# Patient Record
Sex: Male | Born: 1950 | Race: Black or African American | Hispanic: No | Marital: Married | Smoking: Current every day smoker
Health system: Southern US, Community
[De-identification: ages and names within clinical notes are randomized; demographics above are authoritative.]

## PROBLEM LIST (undated history)

## (undated) DIAGNOSIS — I10 Essential (primary) hypertension: Secondary | ICD-10-CM

---

## 1999-02-19 ENCOUNTER — Encounter: Payer: Self-pay | Admitting: Neurosurgery

## 1999-02-19 ENCOUNTER — Ambulatory Visit (HOSPITAL_COMMUNITY): Admission: RE | Admit: 1999-02-19 | Discharge: 1999-02-19 | Payer: Self-pay | Admitting: Neurosurgery

## 2010-02-07 ENCOUNTER — Emergency Department: Payer: Self-pay | Admitting: Emergency Medicine

## 2012-06-16 ENCOUNTER — Ambulatory Visit: Payer: Self-pay | Admitting: Gastroenterology

## 2012-06-17 LAB — PATHOLOGY REPORT

## 2014-02-03 ENCOUNTER — Emergency Department: Payer: Self-pay | Admitting: Emergency Medicine

## 2014-02-03 LAB — COMPREHENSIVE METABOLIC PANEL
Albumin: 3.9 g/dL (ref 3.4–5.0)
Alkaline Phosphatase: 63 U/L
Anion Gap: 3 — ABNORMAL LOW (ref 7–16)
BUN: 13 mg/dL (ref 7–18)
Bilirubin,Total: 0.5 mg/dL (ref 0.2–1.0)
Calcium, Total: 8.7 mg/dL (ref 8.5–10.1)
Chloride: 100 mmol/L (ref 98–107)
Co2: 30 mmol/L (ref 21–32)
Creatinine: 1.01 mg/dL (ref 0.60–1.30)
EGFR (African American): >60
EGFR (Non-African Amer.): >60
Glucose: 94 mg/dL (ref 65–99)
Osmolality: 266 (ref 275–301)
Potassium: 3.7 mmol/L (ref 3.5–5.1)
SGOT(AST): 33 U/L (ref 15–37)
SGPT (ALT): 28 U/L (ref 12–78)
Sodium: 133 mmol/L — ABNORMAL LOW (ref 136–145)
Total Protein: 7.8 g/dL (ref 6.4–8.2)

## 2014-02-03 LAB — URINALYSIS, COMPLETE
Bacteria: NONE SEEN
Bilirubin,UR: NEGATIVE
Blood: NEGATIVE
Glucose,UR: NEGATIVE mg/dL (ref 0–75)
Leukocyte Esterase: NEGATIVE
Nitrite: NEGATIVE
Ph: 5 (ref 4.5–8.0)
Protein: NEGATIVE
RBC,UR: NONE SEEN /HPF (ref 0–5)
Specific Gravity: 1.017 (ref 1.003–1.030)
Squamous Epithelial: NONE SEEN
WBC UR: NONE SEEN /HPF (ref 0–5)

## 2014-02-03 LAB — CBC WITH DIFFERENTIAL/PLATELET
Basophil #: 0 10*3/uL (ref 0.0–0.1)
Basophil %: 1 %
Eosinophil #: 0.2 10*3/uL (ref 0.0–0.7)
Eosinophil %: 6.5 %
HCT: 49.8 % (ref 40.0–52.0)
HGB: 16.4 g/dL (ref 13.0–18.0)
Lymphocyte #: 0.9 10*3/uL — ABNORMAL LOW (ref 1.0–3.6)
Lymphocyte %: 24.7 %
MCH: 31.2 pg (ref 26.0–34.0)
MCHC: 33 g/dL (ref 32.0–36.0)
MCV: 95 fL (ref 80–100)
Monocyte #: 0.6 x10 3/mm (ref 0.2–1.0)
Monocyte %: 14.5 %
Neutrophil #: 2 10*3/uL (ref 1.4–6.5)
Neutrophil %: 53.3 %
Platelet: 239 10*3/uL (ref 150–440)
RBC: 5.27 10*6/uL (ref 4.40–5.90)
RDW: 13.2 % (ref 11.5–14.5)
WBC: 3.8 10*3/uL (ref 3.8–10.6)

## 2014-02-03 LAB — LIPASE, BLOOD: Lipase: 125 U/L (ref 73–393)

## 2014-02-28 ENCOUNTER — Emergency Department: Payer: Self-pay | Admitting: Emergency Medicine

## 2014-02-28 LAB — BASIC METABOLIC PANEL
Anion Gap: 8 (ref 7–16)
BUN: 10 mg/dL (ref 7–18)
Calcium, Total: 9.7 mg/dL (ref 8.5–10.1)
Chloride: 105 mmol/L (ref 98–107)
Co2: 27 mmol/L (ref 21–32)
Creatinine: 0.85 mg/dL (ref 0.60–1.30)
EGFR (African American): >60
EGFR (Non-African Amer.): >60
Glucose: 141 mg/dL — ABNORMAL HIGH (ref 65–99)
Osmolality: 281 (ref 275–301)
Potassium: 4 mmol/L (ref 3.5–5.1)
Sodium: 140 mmol/L (ref 136–145)

## 2014-02-28 LAB — CBC
HCT: 45.1 % (ref 40.0–52.0)
HGB: 15.2 g/dL (ref 13.0–18.0)
MCH: 32.2 pg (ref 26.0–34.0)
MCHC: 33.8 g/dL (ref 32.0–36.0)
MCV: 95 fL (ref 80–100)
Platelet: 277 10*3/uL (ref 150–440)
RBC: 4.73 10*6/uL (ref 4.40–5.90)
RDW: 13.3 % (ref 11.5–14.5)
WBC: 7.7 10*3/uL (ref 3.8–10.6)

## 2014-02-28 LAB — TROPONIN I
Troponin-I: <0.02 ng/mL
Troponin-I: <0.02 ng/mL

## 2018-02-14 ENCOUNTER — Other Ambulatory Visit: Payer: Self-pay

## 2018-02-14 ENCOUNTER — Encounter: Payer: Self-pay | Admitting: Emergency Medicine

## 2018-02-14 DIAGNOSIS — T7840XA Allergy, unspecified, initial encounter: Secondary | ICD-10-CM | POA: Diagnosis not present

## 2018-02-14 DIAGNOSIS — R21 Rash and other nonspecific skin eruption: Secondary | ICD-10-CM | POA: Diagnosis present

## 2018-02-14 DIAGNOSIS — L509 Urticaria, unspecified: Secondary | ICD-10-CM | POA: Diagnosis not present

## 2018-02-14 DIAGNOSIS — F17200 Nicotine dependence, unspecified, uncomplicated: Secondary | ICD-10-CM | POA: Insufficient documentation

## 2018-02-14 DIAGNOSIS — I10 Essential (primary) hypertension: Secondary | ICD-10-CM | POA: Diagnosis not present

## 2018-02-14 NOTE — ED Triage Notes (Addendum)
Patient reports rash to trunk and bilateral arms that started yesterday. Patient reports that he has not taken his blood pressure medication today.

## 2018-02-15 ENCOUNTER — Emergency Department
Admission: EM | Admit: 2018-02-15 | Discharge: 2018-02-15 | Disposition: A | Discharge status: Home / Self Care | Payer: BLUE CROSS/BLUE SHIELD | Attending: Emergency Medicine | Admitting: Emergency Medicine

## 2018-02-15 DIAGNOSIS — T7840XA Allergy, unspecified, initial encounter: Secondary | ICD-10-CM

## 2018-02-15 DIAGNOSIS — R21 Rash and other nonspecific skin eruption: Secondary | ICD-10-CM

## 2018-02-15 DIAGNOSIS — L509 Urticaria, unspecified: Secondary | ICD-10-CM

## 2018-02-15 HISTORY — DX: Essential (primary) hypertension: I10

## 2018-02-15 MED ORDER — FAMOTIDINE 20 MG PO TABS: 20.0000 mg | ORAL_TABLET | Freq: Two times a day (BID) | ORAL | 0 refills | Status: DC

## 2018-02-15 MED ORDER — PREDNISONE 20 MG PO TABS: ORAL_TABLET | ORAL | 0 refills | Status: DC

## 2018-02-15 MED ORDER — FAMOTIDINE 20 MG PO TABS
20.0000 mg | ORAL_TABLET | Freq: Once | ORAL | Status: AC
  Administered 2018-02-15: 20 mg via ORAL
  Filled 2018-02-15: qty 1

## 2018-02-15 MED ORDER — PREDNISONE 20 MG PO TABS
40.0000 mg | ORAL_TABLET | Freq: Once | ORAL | Status: AC
  Administered 2018-02-15: 40 mg via ORAL
  Filled 2018-02-15: qty 2

## 2018-02-15 NOTE — Discharge Instructions (Signed)
1.  Take the following medicines for the next 4 days: Prednisone 40 mg daily Pepcid 20 mg twice daily 2.  Continue Benadryl as needed for itching. 3.  Return to the ER for worsening symptoms, persistent vomiting, difficulty breathing or other concerns.

## 2018-02-15 NOTE — ED Notes (Signed)
Patient c/o red, raised painful rash to back, abdomen and arms beginning Friday. Patient denies allergies. Patient reports Friday he had swelling to bilateral eyes, and left wrist.

## 2018-02-15 NOTE — ED Notes (Signed)
Patient took 25mg  benadryl at 2100 yesterday

## 2018-02-15 NOTE — ED Provider Notes (Signed)
[facility] Emergency Department Provider Note   ____________________________________________   First MD Initiated Contact with Patient 02/15/18 [phone]     (approximate)  I have reviewed the triage vital signs and the nursing notes.   HISTORY  Chief Complaint Rash    HPI Tyler Myers is a 67 y.o. male who presents to the ED from home with a chief complaint of rash.  Patient noted "whelps" to his bilateral arms and trunk yesterday.  Took a Benadryl approximately 9 PM with relief of symptoms.  Denies new exposures such as foods, medicines, over-the-counter's or environmental exposures such as detergents, lotions, etc.  Denies associated facial swelling or difficulty swallowing or breathing.  Has had a sore throat and nasal congestion this week.  Denies recent fever, chills, chest pain, shortness of breath, abdominal pain, nausea, vomiting, diarrhea.  Did not take his blood pressure medication yet today.  Denies recent travel or trauma.   Past Medical History:  Diagnosis Date  . Hypertension     There are no active problems to display for this patient.   History reviewed. No pertinent surgical history.  Prior to Admission medications   Medication Sig Start Date End Date Taking? Authorizing Provider  famotidine (PEPCID) 20 MG tablet Take 1 tablet (20 mg total) by mouth 2 (two) times daily. 02/15/18   Paulette Blanch, MD  predniSONE (DELTASONE) 20 MG tablet 2 tablets daily x 4 days 02/15/18   Paulette Blanch, MD    Allergies Patient has no known allergies.  No family history on file.  Social History Social History   Tobacco Use  . Smoking status: Current Every Day Smoker  . Smokeless tobacco: Current User  Substance Use Topics  . Alcohol use: Not on file  . Drug use: Not on file    Review of Systems  Constitutional: No fever/chills. Eyes: No visual changes. ENT: Positive for sore throat. Cardiovascular: Denies chest pain. Respiratory: Denies  shortness of breath. Gastrointestinal: No abdominal pain.  No nausea, no vomiting.  No diarrhea.  No constipation. Genitourinary: Negative for dysuria. Musculoskeletal: Negative for back pain. Skin: Positive for rash. Neurological: Negative for headaches, focal weakness or numbness.   ____________________________________________   PHYSICAL EXAM:  VITAL SIGNS: ED Triage Vitals  Enc Vitals Group     BP 02/14/18 2212 (!) 183/111     Pulse Rate 02/14/18 2211 71     Resp 02/14/18 2211 18     Temp 02/14/18 2211 98.4 F (36.9 C)     Temp Source 02/14/18 2211 Oral     SpO2 02/14/18 2211 97 %     Weight 02/14/18 2211 150 lb (68 kg)     Height 02/14/18 2211 6\' 4"  (1.93 m)     Head Circumference --      Peak Flow --      Pain Score 02/14/18 2211 0     Pain Loc --      Pain Edu? --      Excl. in [place]? --     Constitutional: Alert and oriented. Well appearing and in no acute distress. Eyes: Conjunctivae are normal. PERRL. EOMI. Head: Atraumatic. Nose: No congestion/rhinnorhea. Mouth/Throat: Mucous membranes are moist.  Oropharynx mildly erythematous without tonsillar swelling, exudates or peritonsillar abscess.  There is no hoarse or muffled voice.  There is no drooling. Neck: No stridor.  Supple neck without meningismus. Cardiovascular: Normal rate, regular rhythm. Grossly normal heart sounds.  Good peripheral circulation. Respiratory: Normal respiratory effort.  No retractions. Lungs CTAB. Gastrointestinal: Soft and nontender. No distention. No abdominal bruits. No CVA tenderness. Musculoskeletal: No lower extremity tenderness nor edema.  No joint effusions. Neurologic:  Normal speech and language. No gross focal neurologic deficits are appreciated. No gait instability. Skin:  Skin is warm, dry and intact. No rash noted.  No petechiae. Psychiatric: Mood and affect are normal. Speech and behavior are normal.  ____________________________________________   LABS (all labs ordered  are listed, but only abnormal results are displayed)  Labs Reviewed - No data to display ____________________________________________  EKG  None ____________________________________________  RADIOLOGY  ED MD interpretation: None  Official radiology report(s): No results found.  ____________________________________________   PROCEDURES  Procedure(s) performed: None  Procedures  Critical Care performed: No  ____________________________________________   INITIAL IMPRESSION / ASSESSMENT AND PLAN / ED COURSE  As part of my medical decision making, I reviewed the following data within the electronic MEDICAL RECORD NUMBER History obtained from family, Nursing notes reviewed and incorporated, Old chart reviewed and Notes from prior ED visits   67 year old male with hypertension who presents with rash which he describes as whelps prior to taking Benadryl.  Patient does not have angioedema, tongue swelling, airway distress.  Will treat with Prednisone and Pepcid with as needed Benadryl.  Strict return precautions given.  Patient and spouse verbalize understanding and agree with plan of care.      ____________________________________________   FINAL CLINICAL IMPRESSION(S) / ED DIAGNOSES  Final diagnoses:  Hives  Rash  Allergic reaction, initial encounter     ED Discharge Orders        Ordered    predniSONE (DELTASONE) 20 MG tablet     02/15/18 0323    famotidine (PEPCID) 20 MG tablet  2 times daily     02/15/18 0932       Note:  This document was prepared using Dragon voice recognition software and may include unintentional dictation errors.    Paulette Blanch, MD 02/15/18 [phone]

## 2019-05-25 DIAGNOSIS — I10 Essential (primary) hypertension: Secondary | ICD-10-CM | POA: Insufficient documentation

## 2019-06-08 DIAGNOSIS — R002 Palpitations: Secondary | ICD-10-CM | POA: Insufficient documentation

## 2019-06-22 DIAGNOSIS — F1010 Alcohol abuse, uncomplicated: Secondary | ICD-10-CM | POA: Insufficient documentation

## 2021-03-14 ENCOUNTER — Other Ambulatory Visit: Payer: Self-pay

## 2021-03-14 ENCOUNTER — Ambulatory Visit (INDEPENDENT_AMBULATORY_CARE_PROVIDER_SITE_OTHER): Payer: Medicare Other | Admitting: Urology

## 2021-03-14 DIAGNOSIS — R972 Elevated prostate specific antigen [PSA]: Secondary | ICD-10-CM | POA: Diagnosis not present

## 2021-03-14 NOTE — Patient Instructions (Signed)
We will repeat the PSA blood test today, if this is still elevated, we will call you to schedule a biopsy of the prostate to rule out prostate cancer  Prostate Cancer Screening  Prostate cancer screening is a test that is done to check for the presence of prostate cancer in men. The prostate gland is a walnut-sized gland that is located below the bladder and in front of the rectum in males. The function of the prostate is to add fluid to semen during ejaculation. Prostate cancer is the second most common type of cancer in men. Who should have prostate cancer screening?  Screening recommendations vary based on age and other risk factors. Screening is recommended if:  You are older than age 3. If you are age 31-69, talk with your health care provider about your need for screening and how often screening should be done. Because most prostate cancers are slow growing and will not cause death, screening is generally reserved in this age group for men who have a 10-15-year life expectancy.  You are younger than age 99, and you have these risk factors: ? Being a black male or a male of African descent. ? Having a father, brother, or uncle who has been diagnosed with prostate cancer. The risk is higher if your family member's cancer occurred at an early age. Screening is not recommended if:  You are younger than age 6.  You are between the ages of 110 and 90 and you have no risk factors.  You are 40 years of age or older. At this age, the risks that screening can cause are greater than the benefits that it may provide. If you are at high risk for prostate cancer, your health care provider may recommend that you have screenings more often or that you start screening at a younger age. How is screening for prostate cancer done? The recommended prostate cancer screening test is a blood test called the prostate-specific antigen (PSA) test. PSA is a protein that is made in the prostate. As you age, your  prostate naturally produces more PSA. Abnormally high PSA levels may be caused by:  Prostate cancer.  An enlarged prostate that is not caused by cancer (benign prostatic hyperplasia, BPH). This condition is very common in older men.  A prostate gland infection (prostatitis). Depending on the PSA results, you may need more tests, such as:  A physical exam to check the size of your prostate gland.  Blood and imaging tests.  A procedure to remove tissue samples from your prostate gland for testing (biopsy). What are the benefits of prostate cancer screening?  Screening can help to identify cancer at an early stage, before symptoms start and when the cancer can be treated more easily.  There is a small chance that screening may lower your risk of dying from prostate cancer. The chance is small because prostate cancer is a slow-growing cancer, and most men with prostate cancer die from a different cause. What are the risks of prostate cancer screening? The main risk of prostate cancer screening is diagnosing and treating prostate cancer that would never have caused any symptoms or problems. This is called overdiagnosisand overtreatment. PSA screening cannot tell you if your PSA is high due to cancer or a different cause. A prostate biopsy is the only procedure to diagnose prostate cancer. Even the results of a biopsy may not tell you if your cancer needs to be treated. Slow-growing prostate cancer may not need any treatment other than  monitoring, so diagnosing and treating it may cause unnecessary stress or other side effects. A prostate biopsy may also cause:  Infection or fever.  A false negative. This is a result that shows that you do not have prostate cancer when you actually do have prostate cancer. Questions to ask your health care provider  When should I start prostate cancer screening?  What is my risk for prostate cancer?  How often do I need screening?  What type of screening  tests do I need?  How do I get my test results?  What do my results mean?  Do I need treatment? Where to find more information  The American Cancer Society: www.cancer.org  American Urological Association: www.auanet.org Contact a health care provider if:  You have difficulty urinating.  You have pain when you urinate or ejaculate.  You have blood in your urine or semen.  You have pain in your back or in the area of your prostate. Summary  Prostate cancer is a common type of cancer in men. The prostate gland is located below the bladder and in front of the rectum. This gland adds fluid to semen during ejaculation.  Prostate cancer screening may identify cancer at an early stage, when the cancer can be treated more easily.  The prostate-specific antigen (PSA) test is the recommended screening test for prostate cancer.  Discuss the risks and benefits of prostate cancer screening with your health care provider. If you are age 35 or older, the risks that screening can cause are greater than the benefits that it may provide. This information is not intended to replace advice given to you by your health care provider. Make sure you discuss any questions you have with your health care provider. Document Revised: 02/04/2020 Document Reviewed: 05/27/2019 Elsevier Patient Education  2021 [facility] (11th ed., pp. [phone]). [place], [place]: Elsevier.">  Transrectal Ultrasound-Guided Prostate Biopsy This is a procedure to take samples of tissue from your prostate. Ultrasound images are used to guide the procedure. It is usually done to check for prostate cancer. What happens before the procedure? Eating and drinking restrictions Follow instructions from your doctor about what you can eat or drink. Medicines  Ask your doctor about changing or stopping: ? Your normal medicines. ? Vitamins, herbs, and supplements. ? Over-the-counter medicines.  Do  not take aspirin or ibuprofen unless you are told to. General instructions  Liquid will be used to clear waste from your butt (enema).  You may have a blood sample taken.  You may have a pee (urine) sample taken.  Plan to have a responsible adult take you home from the hospital or clinic.  If you will be going home right after the procedure, plan to have a responsible adult care for you for the time you are told. This is important.  For your safety, your doctor may: ? Ask you to wash with a soap that kills germs. ? Give you antibiotic medicine. What happens during the procedure?  You will be given one or both of these: ? A medicine to help you relax. ? A medicine to numb the area.  You will be placed on your left side. Your knees may be bent.  A probe with gel on it will be placed in your butt. Pictures will be taken of your prostate and the area around it.  Medicine will be used to numb your prostate.  A needle will be placed in your butt and moved to your  prostate.  Prostate tissue will be removed.  The samples will be sent to a lab. The procedure may vary.   What happens after the procedure?  You will be watched until the medicines you were given have worn off.  You may have some pain in your butt. You will be given medicine for it.  Do not drive for 24 hours if you were given a medicine to help you relax. Summary  This procedure is usually done to check for prostate cancer.  Before the procedure, ask your doctor about changing or stopping your medicines.  You may have some pain in your butt. You will be given medicine for it.  Plan to have a responsible adult take you home from the hospital or clinic. This information is not intended to replace advice given to you by your health care provider. Make sure you discuss any questions you have with your health care provider. Document Revised: 07/28/2020 Document Reviewed: 06/30/2020 Elsevier Patient Education  2021  [employer].

## 2021-03-14 NOTE — Progress Notes (Signed)
   03/14/21 1:23 PM   Tyler Myers 1950-11-08 [id number]  CC: Elevated PSA  HPI: I saw [name] for evaluation of an elevated PSA.  He is a 70 year old African-American male who was recently found to have a PSA of 7 on routine screening.  There are no prior PSA values to review.  He has urinary frequency during the day, but is a very heavy drinker with at least 40 ounces of alcohol per day.  He denies any known family history of prostate or breast cancer.  His mother had cancer but he is not sure what type.  He denies any gross hematuria or dysuria.   PMH: Past Medical History:  Diagnosis Date  . Hypertension    Social History:  reports that he has been smoking. He uses smokeless tobacco. No history on file for alcohol use and drug use.  Physical Exam: Blood pressure 112/69, heart rate 57, 6'2", 167 pounds  Constitutional:  Alert and oriented, No acute distress. Cardiovascular: No clubbing, cyanosis, or edema. Respiratory: Normal respiratory effort, no increased work of breathing. GI: Abdomen is soft, nontender, nondistended, no abdominal masses DRE: Patient deferred  Laboratory Data: Reviewed, see HPI  Pertinent Imaging: None to review  Assessment & Plan:   70 year old male with single isolated elevated PSA of 7, no prior values to review, no significant urinary symptoms, no family history of prostate cancer.  We reviewed the implications of an elevated PSA and the uncertainty surrounding it. In general, a man's PSA increases with age and is produced by both normal and cancerous prostate tissue. The differential diagnosis for elevated PSA includes BPH, prostate cancer, infection, recent intercourse/ejaculation, recent urethroscopic manipulation (foley placement/cystoscopy) or trauma, and prostatitis.   Management of an elevated PSA can include observation or prostate biopsy and we discussed this in detail. Our goal is to detect clinically significant prostate  cancers, and manage with either active surveillance, surgery, or radiation for localized disease. Risks of prostate biopsy include bleeding, infection (including life threatening sepsis), pain, and lower urinary symptoms. Hematuria, hematospermia, and blood in the stool are all common after biopsy and can persist up to 4 weeks.   Repeat PSA today with reflex to free, call with results, pursue biopsy if remains elevated.  If downtrending, continue to trend until normal  Nickolas Madrid, MD 03/14/2021  [facility] [address], [place], [place] [address] [phone]

## 2021-03-15 LAB — PSA TOTAL (REFLEX TO FREE): Prostate Specific Ag, Serum: 7 ng/mL — ABNORMAL HIGH (ref 0.0–4.0)

## 2021-03-15 LAB — FPSA% REFLEX
% FREE PSA: 11.6 %
PSA, FREE: 0.81 ng/mL

## 2021-03-16 ENCOUNTER — Telehealth: Payer: Self-pay

## 2021-03-16 NOTE — Telephone Encounter (Signed)
-----   Message from Billey Co, MD sent at 03/15/2021  8:16 AM EDT ----- PSA remains elevated at 7, please schedule prostate biopsy as we discussed in clinic and review instructions  Nickolas Madrid, MD 03/15/2021

## 2021-03-16 NOTE — Telephone Encounter (Signed)
Called pt, family member answers, states that pt nor wife are home. LM to have patient or spouse call back per DPR. 1st attempt.

## 2021-03-19 NOTE — Telephone Encounter (Signed)
Called pt, informed him of the information below. Biopsy instructions given verbally, written copy mailed in addition as well as appointment reminder.

## 2021-03-29 ENCOUNTER — Other Ambulatory Visit: Payer: Self-pay

## 2021-03-29 ENCOUNTER — Ambulatory Visit (INDEPENDENT_AMBULATORY_CARE_PROVIDER_SITE_OTHER): Payer: Medicare Other | Admitting: Urology

## 2021-03-29 ENCOUNTER — Encounter: Payer: Self-pay | Admitting: Urology

## 2021-03-29 VITALS — BP 163/89 | HR 64 | Ht 72.0 in | Wt 160.0 lb

## 2021-03-29 DIAGNOSIS — R972 Elevated prostate specific antigen [PSA]: Secondary | ICD-10-CM

## 2021-03-29 MED ORDER — CEFTRIAXONE SODIUM 1 G IJ SOLR: 1.0000 g | Freq: Once | INTRAMUSCULAR | Status: DC

## 2021-03-29 MED ORDER — LEVOFLOXACIN 500 MG PO TABS: 500.0000 mg | ORAL_TABLET | Freq: Once | ORAL | Status: DC

## 2021-03-29 NOTE — Progress Notes (Signed)
Unable to proceed with biopsy patient did not stop mobic

## 2021-03-29 NOTE — Patient Instructions (Addendum)
Please stop Mobic( Meloxicam) 7 days before  Prostate Biopsy Instructions  Stop all aspirin or blood thinners (aspirin, plavix, coumadin, warfarin, motrin, ibuprofen, advil, aleve, naproxen, naprosyn) for 7 days prior to the procedure.  If you have any questions about stopping these medications, please contact your primary care physician or cardiologist.  Having a light meal prior to the procedure is recommended.  If you are diabetic or have low blood sugar please bring a small snack or glucose tablet.  A Fleets enema is needed to be purchased over the counter at a local pharmacy and used 2 hours before you scheduled appointment.  This can be purchased over the counter at any pharmacy.  Antibiotics will be administered in the clinic at the time of the procedure unless otherwise specified.    Please bring someone with you to the procedure to drive you home.  A follow up appointment has been scheduled for you to receive the results of the biopsy.  If you have any questions or concerns, please feel free to call the office at [phone] or send a Mychart message.    Thank you, Staff at [facility]

## 2021-03-29 NOTE — Progress Notes (Signed)
no

## 2021-04-05 ENCOUNTER — Ambulatory Visit (INDEPENDENT_AMBULATORY_CARE_PROVIDER_SITE_OTHER): Payer: Medicare Other | Admitting: Urology

## 2021-04-05 ENCOUNTER — Encounter: Payer: Self-pay | Admitting: Urology

## 2021-04-05 ENCOUNTER — Other Ambulatory Visit: Payer: Self-pay

## 2021-04-05 ENCOUNTER — Ambulatory Visit: Payer: Self-pay | Admitting: Urology

## 2021-04-05 VITALS — BP 144/80 | HR 58 | Ht 72.0 in | Wt 166.8 lb

## 2021-04-05 DIAGNOSIS — R972 Elevated prostate specific antigen [PSA]: Secondary | ICD-10-CM | POA: Diagnosis not present

## 2021-04-05 MED ORDER — GENTAMICIN SULFATE 40 MG/ML IJ SOLN
80.0000 mg | Freq: Once | INTRAMUSCULAR | Status: AC
  Administered 2021-04-05: 80 mg via INTRAMUSCULAR

## 2021-04-05 MED ORDER — LEVOFLOXACIN 500 MG PO TABS
500.0000 mg | ORAL_TABLET | Freq: Once | ORAL | Status: AC
  Administered 2021-04-05: 500 mg via ORAL

## 2021-04-05 NOTE — Progress Notes (Addendum)
   04/05/21  Indication: Elevated PSA, 7(11.6% free)  Prostate Biopsy Procedure   Informed consent was obtained, and we discussed the risks of bleeding and infection/sepsis. A time out was performed to ensure correct patient identity.  Pre-Procedure: - Last PSA Level: 7(11.6% free) -Ceftriaxone and levaquin given for antibiotic prophylaxis -DRE smooth, no nodules or masses - Transrectal Ultrasound performed revealing a 55 gm prostate, PSA density 0.13 - No significant hypoechoic or median lobe noted  Procedure: - Prostate block performed using 10 cc 1% lidocaine and biopsies taken from sextant areas, a total of 12 under ultrasound guidance.  Post-Procedure: - Patient tolerated the procedure well - He was counseled to seek immediate medical attention if experiences significant bleeding, fevers, or severe pain - Return in one week to discuss biopsy results  Assessment/ Plan: Will follow up in 1-2 weeks to discuss pathology  Nickolas Madrid, MD 04/05/2021

## 2021-04-05 NOTE — Patient Instructions (Signed)

## 2021-04-11 LAB — SURGICAL PATHOLOGY

## 2021-04-12 ENCOUNTER — Ambulatory Visit (INDEPENDENT_AMBULATORY_CARE_PROVIDER_SITE_OTHER): Payer: Medicare Other | Admitting: Urology

## 2021-04-12 ENCOUNTER — Encounter: Payer: Self-pay | Admitting: Urology

## 2021-04-12 ENCOUNTER — Other Ambulatory Visit: Payer: Self-pay

## 2021-04-12 VITALS — BP 145/83 | HR 53 | Ht 76.0 in | Wt 160.0 lb

## 2021-04-12 DIAGNOSIS — R972 Elevated prostate specific antigen [PSA]: Secondary | ICD-10-CM

## 2021-04-12 NOTE — Progress Notes (Signed)
   04/12/2021 4:34 PM   Tyler Myers [birth year] [id number]  Reason for visit: Follow up prostate biopsy results  HPI: 70 year old male who underwent a prostate biopsy on 04/05/2021 for an elevated PSA of 7.  This showed a 55 g prostate with a PSA density of 0.13.  Pathology showed only benign prostatic tissue.  We discussed the 10 to 20% risk of a false negative, and the need to continue to trend the PSA moving forward.    RTC 6 months with PSA prior   Billey Co, MD  [facility] [address] [place], [place] [address] [phone]

## 2021-10-09 ENCOUNTER — Other Ambulatory Visit: Payer: Self-pay

## 2021-10-11 ENCOUNTER — Ambulatory Visit: Payer: Medicare Other | Admitting: Urology

## 2021-11-01 ENCOUNTER — Other Ambulatory Visit: Payer: Medicare Other

## 2021-11-01 ENCOUNTER — Other Ambulatory Visit: Payer: Self-pay

## 2021-11-01 DIAGNOSIS — R972 Elevated prostate specific antigen [PSA]: Secondary | ICD-10-CM

## 2021-11-02 LAB — PSA TOTAL (REFLEX TO FREE): Prostate Specific Ag, Serum: 12.4 ng/mL — ABNORMAL HIGH (ref 0.0–4.0)

## 2021-11-08 ENCOUNTER — Encounter: Payer: Self-pay | Admitting: Urology

## 2021-11-08 ENCOUNTER — Other Ambulatory Visit: Payer: Self-pay

## 2021-11-08 ENCOUNTER — Ambulatory Visit (INDEPENDENT_AMBULATORY_CARE_PROVIDER_SITE_OTHER): Payer: Medicare Other | Admitting: Urology

## 2021-11-08 VITALS — BP 167/92 | HR 66 | Ht 72.0 in | Wt 160.0 lb

## 2021-11-08 DIAGNOSIS — R972 Elevated prostate specific antigen [PSA]: Secondary | ICD-10-CM | POA: Diagnosis not present

## 2021-11-08 NOTE — Progress Notes (Signed)
° °  11/08/2021 1:39 PM   Tyler Myers 1950-12-31 [id number]  Reason for visit: Follow up elevated PSA  HPI: 71 year old male who underwent a prostate biopsy in June 2022 for an elevated PSA of 7(11.6% free) that showed a 55 g prostate with a PSA density of 0.13, and pathology showed only benign prostate tissue.  Repeat PSA on 11/01/2021 continued to rise at 12.4.  We discussed options including repeat PSA in 1 to 2 weeks versus prostate MRI.  With his significant increase in the PSA and recent negative biopsy, I strongly recommended prostate MRI to evaluate for any suspicious lesions.  We discussed the need for targeted biopsy in the future if any abnormal areas found on MRI.  Prostate MRI, call with results   Billey Co, [facility] [address], [place], [place] [address] [phone]

## 2021-11-08 NOTE — Patient Instructions (Signed)
Prostate MRI Prep: ? ?1- No ejaculation 48 hours prior to exam ? ?2- No food or drink or caffeine 4 hours prior to exam ? ?3- Fleets enema needs to be done 4 hours prior to exam  ? ?4- Urinate just prior to exam  ?

## 2021-11-29 ENCOUNTER — Other Ambulatory Visit: Payer: Self-pay

## 2021-11-29 ENCOUNTER — Ambulatory Visit
Admission: RE | Admit: 2021-11-29 | Discharge: 2021-11-29 | Disposition: A | Discharge status: Home / Self Care | Payer: Medicare Other | Source: Ambulatory Visit | Attending: Urology | Admitting: Urology

## 2021-11-29 DIAGNOSIS — R972 Elevated prostate specific antigen [PSA]: Secondary | ICD-10-CM | POA: Diagnosis present

## 2021-11-29 IMAGING — MR MR PROSTATE WO/W CM
56 series · 56 of 56 positions shown · IV contrast (7.5ml Gadavist)
Comparison: None.

CLINICAL DATA: Elevated PSA level of 12.4 on [DATE]. Recent
negative biopsy on [DATE].

EXAM:
MR PROSTATE WITHOUT AND WITH CONTRAST
TECHNIQUE: Multiplanar multisequence MRI images were obtained of the pelvis
centered about the prostate. Pre and post contrast images were
obtained.
CONTRAST:  7.5mL GADAVIST GADOBUTROL 1 MMOL/ML IV SOLN

[Series 3: ax in&out whole · axial · 6.0mm · 0.74mm/px · 1 of 35 slices shown (1 of 2)]
[im 1/35]
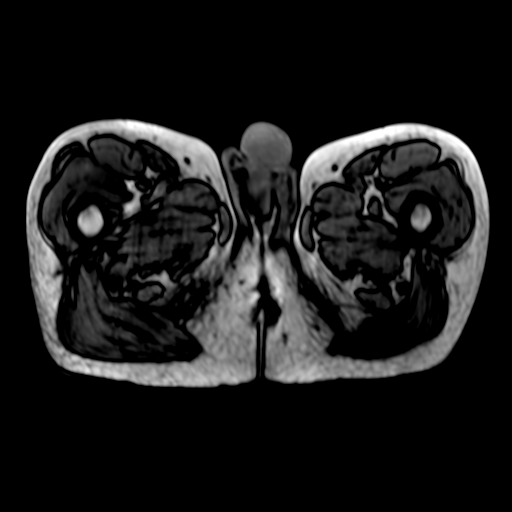

[Series 3: ax in&out whole · axial · 6.0mm · 0.74mm/px · 1 of 35 slices shown (2 of 2)]
[im 1/35]
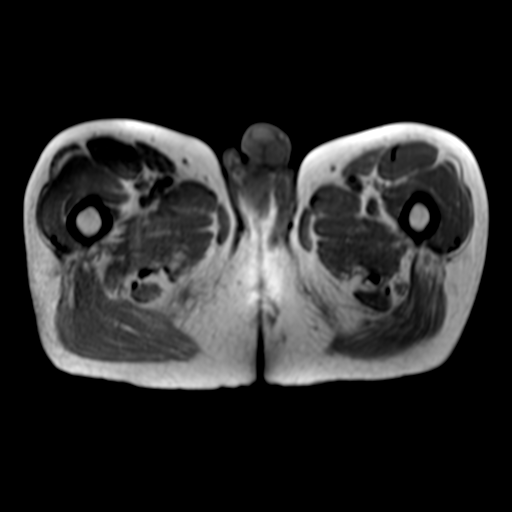

[Series 4: T2 · coronal · 3.0mm · 0.70mm/px · 1 of 35 slices shown (1 of 3)]
[im 1/35]
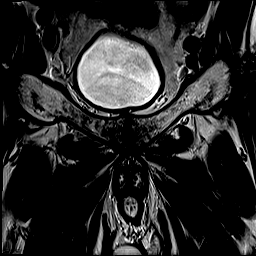

[Series 5: T2 · axial · 3.0mm · 0.56mm/px · 1 of 27 slices shown (2 of 3)]
[im 1/27]
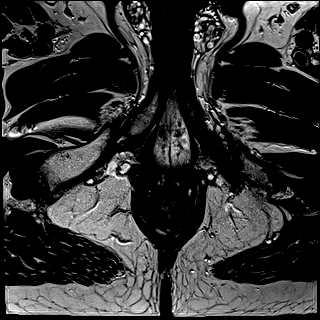

[Series 6: DWI · axial · 3.0mm · 0.86mm/px · 1 of 81 slices shown (1 of 3)]
[im 1/81]
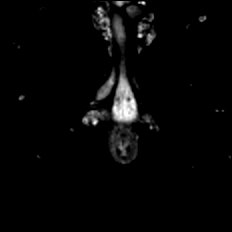

[Series 7: DWI · axial · 3.0mm · 0.86mm/px · 1 of 27 slices shown (2 of 3)]
[im 1/27]
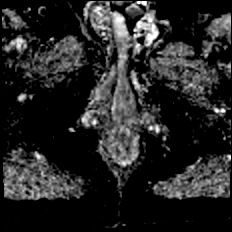

[Series 8: DWI · axial · 3.0mm · 0.86mm/px · 1 of 27 slices shown (3 of 3)]
[im 1/27]
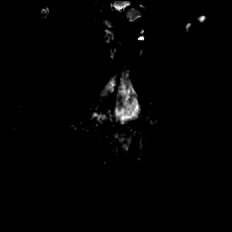

[Series 9: T2 · axial · 1.0mm · 1.04mm/px · 1 of 80 slices shown (3 of 3)]
[im 1/80]
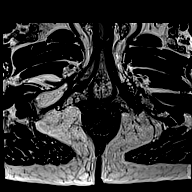

[Series 10: T1 · axial · 3.0mm · 1.15mm/px · 1 of 28 slices shown (1 of 48)]
[im 1/28]
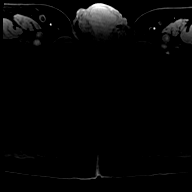

[Series 11: T1 · axial · 3.0mm · 1.15mm/px · 1 of 28 slices shown (2 of 48)]
[im 1/28]
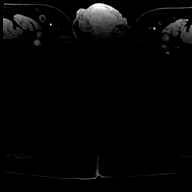

[Series 12: T1 · axial · 3.0mm · 1.15mm/px · 1 of 28 slices shown (3 of 48)]
[im 1/28]
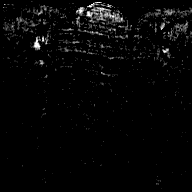

[Series 13: T1 · axial · 3.0mm · 1.15mm/px · 1 of 28 slices shown (4 of 48)]
[im 1/28]
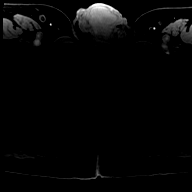

[Series 14: T1 · axial · 3.0mm · 1.15mm/px · 1 of 28 slices shown (5 of 48)]
[im 1/28]
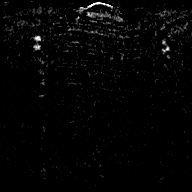

[Series 15: T1 · axial · 3.0mm · 1.15mm/px · 1 of 28 slices shown (6 of 48)]
[im 1/28]
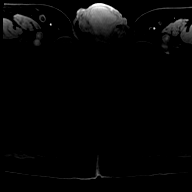

[Series 16: T1 · axial · 3.0mm · 1.15mm/px · 1 of 28 slices shown (7 of 48)]
[im 1/28]
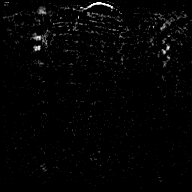

[Series 17: T1 · axial · 3.0mm · 1.15mm/px · 1 of 28 slices shown (8 of 48)]
[im 1/28]
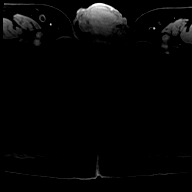

[Series 18: T1 · axial · 3.0mm · 1.15mm/px · 1 of 28 slices shown (9 of 48)]
[im 1/28]
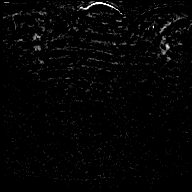

[Series 19: T1 · axial · 3.0mm · 1.15mm/px · 1 of 28 slices shown (10 of 48)]
[im 1/28]
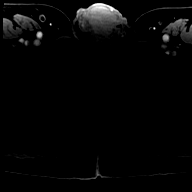

[Series 20: T1 · axial · 3.0mm · 1.15mm/px · 1 of 28 slices shown (11 of 48)]
[im 1/28]
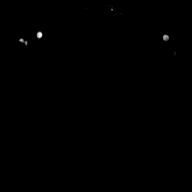

[Series 21: T1 · axial · 3.0mm · 1.15mm/px · 1 of 28 slices shown (12 of 48)]
[im 1/28]
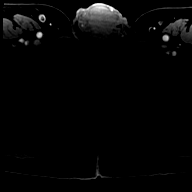

[Series 22: T1 · axial · 3.0mm · 1.15mm/px · 1 of 28 slices shown (13 of 48)]
[im 1/28]
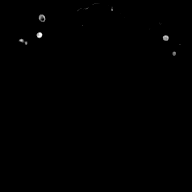

[Series 23: T1 · axial · 3.0mm · 1.15mm/px · 1 of 28 slices shown (14 of 48)]
[im 1/28]
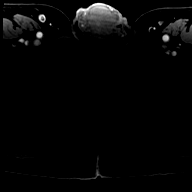

[Series 24: T1 · axial · 3.0mm · 1.15mm/px · 1 of 28 slices shown (15 of 48)]
[im 1/28]
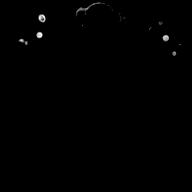

[Series 25: T1 · axial · 3.0mm · 1.15mm/px · 1 of 28 slices shown (16 of 48)]
[im 1/28]
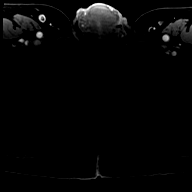

[Series 26: T1 · axial · 3.0mm · 1.15mm/px · 1 of 28 slices shown (17 of 48)]
[im 1/28]
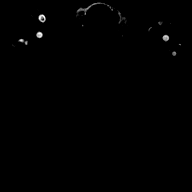

[Series 27: T1 · axial · 3.0mm · 1.15mm/px · 1 of 28 slices shown (18 of 48)]
[im 1/28]
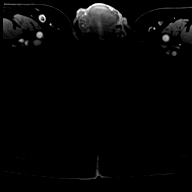

[Series 28: T1 · axial · 3.0mm · 1.15mm/px · 1 of 28 slices shown (19 of 48)]
[im 1/28]
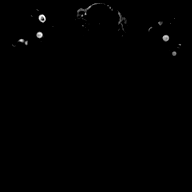

[Series 29: T1 · axial · 3.0mm · 1.15mm/px · 1 of 28 slices shown (20 of 48)]
[im 1/28]
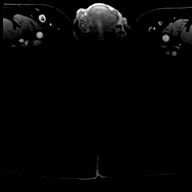

[Series 30: T1 · axial · 3.0mm · 1.15mm/px · 1 of 28 slices shown (21 of 48)]
[im 1/28]
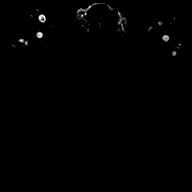

[Series 31: T1 · axial · 3.0mm · 1.15mm/px · 1 of 28 slices shown (22 of 48)]
[im 1/28]
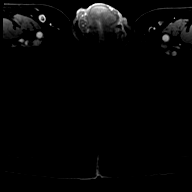

[Series 32: T1 · axial · 3.0mm · 1.15mm/px · 1 of 28 slices shown (23 of 48)]
[im 1/28]
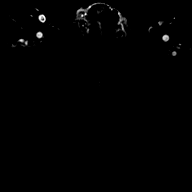

[Series 33: T1 · axial · 3.0mm · 1.15mm/px · 1 of 28 slices shown (24 of 48)]
[im 1/28]
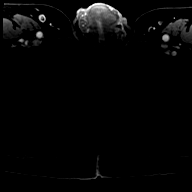

[Series 34: T1 · axial · 3.0mm · 1.15mm/px · 1 of 28 slices shown (25 of 48)]
[im 1/28]
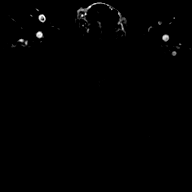

[Series 35: T1 · axial · 3.0mm · 1.15mm/px · 1 of 28 slices shown (26 of 48)]
[im 1/28]
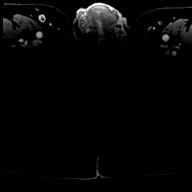

[Series 36: T1 · axial · 3.0mm · 1.15mm/px · 1 of 28 slices shown (27 of 48)]
[im 1/28]
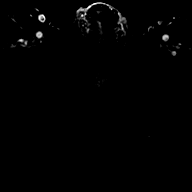

[Series 37: T1 · axial · 3.0mm · 1.15mm/px · 1 of 28 slices shown (28 of 48)]
[im 1/28]
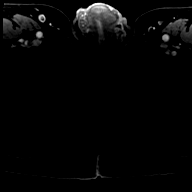

[Series 38: T1 · axial · 3.0mm · 1.15mm/px · 1 of 28 slices shown (29 of 48)]
[im 1/28]
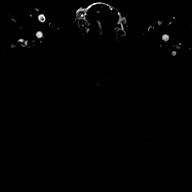

[Series 39: T1 · axial · 3.0mm · 1.15mm/px · 1 of 28 slices shown (30 of 48)]
[im 1/28]
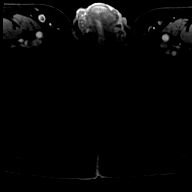

[Series 40: T1 · axial · 3.0mm · 1.15mm/px · 1 of 28 slices shown (31 of 48)]
[im 1/28]
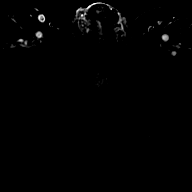

[Series 41: T1 · axial · 3.0mm · 1.15mm/px · 1 of 28 slices shown (32 of 48)]
[im 1/28]
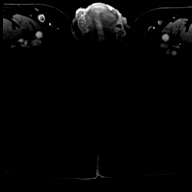

[Series 42: T1 · axial · 3.0mm · 1.15mm/px · 1 of 28 slices shown (33 of 48)]
[im 1/28]
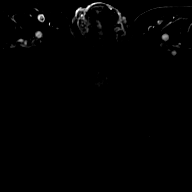

[Series 43: T1 · axial · 3.0mm · 1.15mm/px · 1 of 28 slices shown (34 of 48)]
[im 1/28]
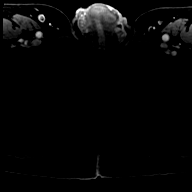

[Series 44: T1 · axial · 3.0mm · 1.15mm/px · 1 of 28 slices shown (35 of 48)]
[im 1/28]
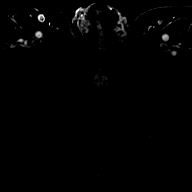

[Series 45: T1 · axial · 3.0mm · 1.15mm/px · 1 of 28 slices shown (36 of 48)]
[im 1/28]
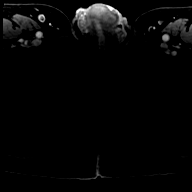

[Series 46: T1 · axial · 3.0mm · 1.15mm/px · 1 of 28 slices shown (37 of 48)]
[im 1/28]
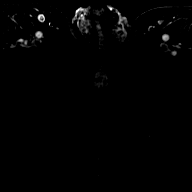

[Series 47: T1 · axial · 3.0mm · 1.15mm/px · 1 of 28 slices shown (38 of 48)]
[im 1/28]
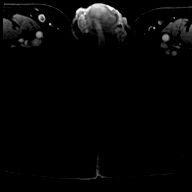

[Series 48: T1 · axial · 3.0mm · 1.15mm/px · 1 of 28 slices shown (39 of 48)]
[im 1/28]
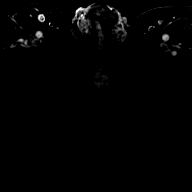

[Series 49: T1 · axial · 3.0mm · 1.15mm/px · 1 of 28 slices shown (40 of 48)]
[im 1/28]
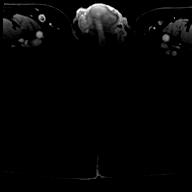

[Series 50: T1 · axial · 3.0mm · 1.15mm/px · 1 of 28 slices shown (41 of 48)]
[im 1/28]
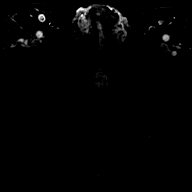

[Series 51: T1 · axial · 3.0mm · 1.15mm/px · 1 of 28 slices shown (42 of 48)]
[im 1/28]
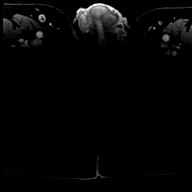

[Series 52: T1 · axial · 3.0mm · 1.15mm/px · 1 of 28 slices shown (43 of 48)]
[im 1/28]
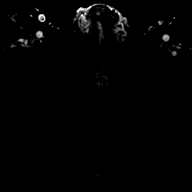

[Series 53: T1 · axial · 3.0mm · 1.15mm/px · 1 of 28 slices shown (44 of 48)]
[im 1/28]
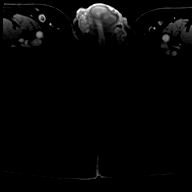

[Series 54: T1 · axial · 3.0mm · 1.15mm/px · 1 of 28 slices shown (45 of 48)]
[im 1/28]
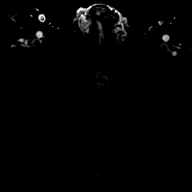

[Series 55: T1 · axial · 3.0mm · 1.15mm/px · 1 of 28 slices shown (46 of 48)]
[im 1/28]
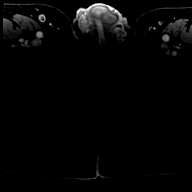

[Series 56: T1 · axial · 3.0mm · 1.15mm/px · 1 of 28 slices shown (47 of 48)]
[im 1/28]
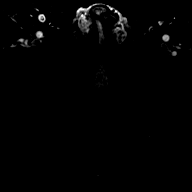

[Series 57: T1 · axial · 3.0mm · 1.15mm/px · 1 of 28 slices shown (48 of 48)]
[im 1/28]
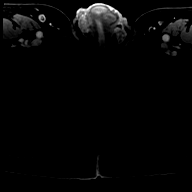

[56 of 56 positions shown; findings below may reference images not displayed]

FINDINGS: Prostate:

Region of interest # 1: PI-RADS category 4 lesion of the right
anterior transition zone in the midgland, with ill-defined reduced
T2 signal (image 35, series 9, prominently reduced ADC map activity,
and restricted diffusion (image 12 of series 7 and 8). This measures
1.0 cc (1.4 by 1.2 by 1.2 cm). Cannot exclude involvement of the
anterior stroma.

Region of interest # 2: PI-RADS category 4 lesion of the right
anterior peripheral zone in the mid gland, with focally reduced T2
and ADC map activity (image 46, series 9) and focal early
enhancement (image 16, series 24). This measures 0.54 cc (1.5 by
by 0.8 cm).

Scattered peripheral zone stranding bilaterally, likely
postinflammatory and considered PI-RADS category 2. Encapsulated
nodularity in the transition zone compatible with benign prostatic
hypertrophy.

Volume: 3D volumetric analysis: Prostate volume 68.37 cc (6.1 by
by 4.9 cm).

Transcapsular spread:  Absent

Seminal vesicle involvement: Absent

Neurovascular bundle involvement: Absent

Pelvic adenopathy: Absent

Bone metastasis: Absent

Other findings: No supplemental non-categorized findings.
IMPRESSION: 1. PI-RADS category 4 lesion of the right anterior transition zone
in the mid gland. PI-RADS category 4 lesion of the right anterior
peripheral zone in the mid gland. Targeting data sent to [facility].
2. Prostatomegaly and benign prostatic hypertrophy.

## 2021-11-29 MED ORDER — GADOBUTROL 1 MMOL/ML IV SOLN
7.5000 mL | Freq: Once | INTRAVENOUS | Status: AC | PRN
  Administered 2021-11-29: 7.5 mL via INTRAVENOUS

## 2021-12-07 ENCOUNTER — Telehealth: Payer: Self-pay

## 2021-12-07 DIAGNOSIS — R9389 Abnormal findings on diagnostic imaging of other specified body structures: Secondary | ICD-10-CM

## 2021-12-07 NOTE — Telephone Encounter (Signed)
Called pt's wife informed her of the information below, wife voiced understanding. Referral placed.

## 2021-12-07 NOTE — Telephone Encounter (Signed)
-----   Message from Billey Co, MD sent at 12/06/2021  9:02 AM EST ----- Prostate MRI does show 2 suspicious lesions for possible prostate cancer.  Would recommend biopsy of the specific areas as we discussed in clinic, please place referral [facility] for MRI fusion biopsy, and follow-up with [place] 1 week later to discuss results, thank  Nickolas Madrid, MD 12/06/2021

## 2022-01-21 ENCOUNTER — Ambulatory Visit: Payer: Medicare Other | Admitting: Urology

## 2022-03-04 ENCOUNTER — Other Ambulatory Visit: Payer: Self-pay | Admitting: Urology

## 2022-03-07 ENCOUNTER — Ambulatory Visit (INDEPENDENT_AMBULATORY_CARE_PROVIDER_SITE_OTHER): Payer: Medicare Other | Admitting: Urology

## 2022-03-07 ENCOUNTER — Encounter: Payer: Self-pay | Admitting: Urology

## 2022-03-07 VITALS — BP 148/88 | HR 54 | Ht 76.0 in | Wt 160.0 lb

## 2022-03-07 DIAGNOSIS — C61 Malignant neoplasm of prostate: Secondary | ICD-10-CM | POA: Diagnosis not present

## 2022-03-07 NOTE — Patient Instructions (Signed)

## 2022-03-07 NOTE — Progress Notes (Signed)
? ?  03/07/2022 ?12:09 PM  ? ?Tyler Myers ?November 01, 1950 ?[id number] ? ?Reason for visit: Discuss prostate biopsy results ? ?HPI: ?71 year old male with significant alcohol use who underwent a prostate biopsy in June 2022 for an elevated PSA of 7 that showed a 55 g prostate with a PSA density of 0.13 and pathology showed only benign tissue.  Repeat PSA continued to rise at 12.4, and he ultimately underwent a prostate MRI.  This showed a 68 g prostate with PI-RADS 4 lesions of the right anterior transition zone in the mid gland, as well as the right anterior peripheral zone in the mid gland.  He underwent a fusion biopsy in [place] which showed Gleason score 4+3=7 disease in the regions of interest with 90% max core involvement.  Additionally, there were 2 cores of Gleason score 3+3=6 involving 5% at the right apex. ? ?He has some urinary frequency at baseline secondary to his high alcohol intake, but his urinary symptoms improve if he decreases his alcohol use.  He has significant problems with ED at baseline.  He denies any prior abdominal surgeries ? ?We had a lengthy conversation today about the patient's new diagnosis of prostate cancer.  We reviewed the risk classifications per the AUA guidelines including very low risk, low risk, intermediate risk, and high risk disease, and the need for additional staging imaging with CT and bone scan in patients with unfavorable intermediate risk and high risk disease.  I explained that his life expectancy, clinical stage, Gleason score, PSA, and other co-morbidities influence treatment strategies.  We discussed the roles of active surveillance, radiation therapy, surgical therapy with robotic prostatectomy, and hormone therapy with androgen deprivation.  We discussed that patients urinary symptoms also impact treatment strategy, as patients with severe lower urinary tract symptoms may have significant worsening or even develop urinary retention after undergoing  radiation. ? ?In regards to surgery, we discussed robotic prostatectomy +/- lymphadenectomy at length.  The procedure takes 3 to 4 hours, and patient's typically discharge home on post-op day #1.  A Foley catheter is left in place for 7 to 10 days to allow for healing of the vesicourethral anastomosis.  There is a small risk of bleeding, infection, damage to surrounding structures or bowel, hernia, DVT/PE, or serious cardiac or pulmonary complications.  We discussed at length post-op side effects including erectile dysfunction, and the importance of pre-operative erectile function on long-term outcomes.  Even with a nerve sparing approach, there is an approximately 25% rate of permanent erectile dysfunction.  We also discussed postop urinary incontinence at length.  We expect patients to have stress incontinence post-operatively that will improve over period of weeks to months.  Less than 10% of men will require a pad at 1 year after surgery.  Patients will need to avoid heavy lifting and strenuous activity for 3 to 4 weeks, but most men return to their baseline activity status by 6 weeks. ? ?In summary, Tyler Myers is a 71 y.o. 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 He is leaning toward pursuing radiation, and an appointment has been scheduled with Dr. Baruch Gouty to discuss options further ? ?I spent 45 total minutes on the day of the encounter including pre-visit review of the medical record, face-to-face time with the patient, and post visit ordering of labs/imaging/tests. ? ? ?Billey Co, MD ? ?[facility] ?[address], [address] ?[place], [place] [address] ?([phone] ? ?

## 2022-03-15 ENCOUNTER — Encounter: Payer: Self-pay | Admitting: Radiation Oncology

## 2022-03-15 ENCOUNTER — Ambulatory Visit
Admission: RE | Admit: 2022-03-15 | Discharge: 2022-03-15 | Disposition: A | Discharge status: Home / Self Care | Payer: Medicare Other | Source: Ambulatory Visit | Attending: Radiation Oncology | Admitting: Radiation Oncology

## 2022-03-15 VITALS — BP 160/100 | HR 58 | Temp 96.3°F | Resp 18 | Ht 76.0 in | Wt 158.0 lb

## 2022-03-15 DIAGNOSIS — C61 Malignant neoplasm of prostate: Secondary | ICD-10-CM | POA: Diagnosis present

## 2022-03-15 DIAGNOSIS — Z79899 Other long term (current) drug therapy: Secondary | ICD-10-CM | POA: Insufficient documentation

## 2022-03-15 DIAGNOSIS — Z923 Personal history of irradiation: Secondary | ICD-10-CM | POA: Insufficient documentation

## 2022-03-15 DIAGNOSIS — R351 Nocturia: Secondary | ICD-10-CM | POA: Diagnosis not present

## 2022-03-15 DIAGNOSIS — I10 Essential (primary) hypertension: Secondary | ICD-10-CM | POA: Diagnosis not present

## 2022-03-15 NOTE — Consult Note (Signed)
NEW PATIENT EVALUATION  Name: Tyler Myers  MRN: [id number]  Date:   03/15/2022     DOB: [birth year]   This 71 y.o. 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  REFERRING PHYSICIAN: [facility] [place]:  Chief Complaint  Patient presents with   Consult    Prostate    DIAGNOSIS: The encounter diagnosis was Malignant neoplasm of prostate ([place]).   PREVIOUS INVESTIGATIONS:  MRI scan reviewed Clinical notes reviewed Pathology reports reviewed  HPI: Patient is a 71 year old male followed by urology for over a year for elevated PSA of 7.  Biopsy at that time showed benign tissue.  His PSA continues to rise now to 12.4.  He underwent a prostate MRI scan showing a PI-RADS category 4 lesion in the right anterior transitional zone in the mid gland and a PI-RADS category 4 lesion in the right anterior peripheral zone of the mid gland.  Patient underwent fusion biopsy showing 4 of 14 cores +2 of which were Gleason 7 (4+3) the other was Gleason 6 (3+3).  Patient has declined surgical intervention and is now referred [facility] for consideration of treatment.  He is prostate volume is 68 cc.  He is fairly asymptomatic does have nocturia x3.  No frequency urgency and no GI problems.  He is having no bone pain.  PLANNED TREATMENT REGIMEN: Image guided IMRT radiation therapy  PAST MEDICAL HISTORY:  has a past medical history of Hypertension.    PAST SURGICAL HISTORY: History reviewed. No pertinent surgical history.  FAMILY HISTORY: family history is not on file.  SOCIAL HISTORY:  reports that he has been smoking. He uses smokeless tobacco.  ALLERGIES: Clonidine hcl and Hydrochlorothiazide  MEDICATIONS:  Current Outpatient Medications  Medication Sig Dispense Refill   amLODipine (NORVASC) 5 MG tablet Take 1 tablet by mouth daily.     atorvastatin  (LIPITOR) 40 MG tablet Take 1 tablet by mouth at bedtime.     bisoprolol-hydrochlorothiazide (ZIAC) 10-6.25 MG tablet Take 1 tablet by mouth daily.     cetirizine (ZYRTEC) 10 MG tablet Take 10 mg by mouth daily as needed.     meloxicam (MOBIC) 7.5 MG tablet Take 7.5 mg by mouth daily.     No current facility-administered medications for this encounter.    ECOG PERFORMANCE STATUS:  0 - Asymptomatic  REVIEW OF SYSTEMS: Patient denies any weight loss, fatigue, weakness, fever, chills or night sweats. Patient denies any loss of vision, blurred vision. Patient denies any ringing  of the ears or hearing loss. No irregular heartbeat. Patient denies heart murmur or history of fainting. Patient denies any chest pain or pain radiating to her upper extremities. Patient denies any shortness of breath, difficulty breathing at night, cough or hemoptysis. Patient denies any swelling in the lower legs. Patient denies any nausea vomiting, vomiting of blood, or coffee ground material in the vomitus. Patient denies any stomach pain. Patient states has had normal bowel movements no significant constipation or diarrhea. Patient denies any dysuria, hematuria or significant nocturia. Patient denies any problems walking, swelling in the joints or loss of balance. Patient denies any skin changes, loss of hair or loss of weight. Patient denies any excessive worrying or anxiety or significant depression. Patient denies any problems with insomnia. Patient denies excessive thirst, polyuria, polydipsia. Patient denies any swollen glands, patient denies easy bruising or easy bleeding.  Patient denies any recent infections, allergies or URI. Patient "s visual fields have not changed significantly in recent time.   PHYSICAL EXAM: BP (!) 160/100   Pulse (!) 58   Temp (!) 96.3 F (35.7 C)   Resp 18   Ht '6\' 4"'$  (1.93 m)   Wt 158 lb (71.7 kg)   BMI 19.23 kg/m  Well-developed well-nourished patient in NAD. HEENT reveals PERLA, EOMI,  discs not visualized.  Oral cavity is clear. No oral mucosal lesions are identified. Neck is clear without evidence of cervical or supraclavicular adenopathy. Lungs are clear to A&P. Cardiac examination is essentially unremarkable with regular rate and rhythm without murmur rub or thrill. Abdomen is benign with no organomegaly or masses noted. Motor sensory and DTR levels are equal and symmetric in the upper and lower extremities. Cranial nerves II through XII are grossly intact. Proprioception is intact. No peripheral adenopathy or edema is identified. No motor or sensory levels are noted. Crude visual fields are within normal range.  LABORATORY DATA: Pathology reports reviewed    RADIOLOGY RESULTS: MRI scan reviewed compatible with above-stated findings   IMPRESSION: Stage IIc adenocarcinoma the prostate in 71 year old male Gleason 7 (4+3) presenting with a PSA of 12.4.  PLAN: This time patient has declined surgical intervention.  Based on the size of his prostate he is not a candidate for I-125 interstitial implant.  I have recommended IMRT image guided radiation therapy to [address] to his prostate.  I have run the [facility] nomogram showing a only 12% chance of lymph node involvement.  He does have a 60% chance of extracapsular extension and seminal vesicle involvement at 10%.  I will target only his prostate gland.  We will also treat his proximal SV.  Risks and benefits of treatment including increased lower urinary tract symptoms diarrhea fatigue alteration of blood count skin reaction all reviewed with the patient.  He seems to comprehend my treatment plan well.  I asked urology to place fiducial markers in his prostate for daily image guided treatment.  I have also would like him to start on a 30-monthADT therapy of Eligard both will be arranged through urology.  After those are placed we will make a simulation appointment.  I would like to take this opportunity to thank you for  allowing me to participate in the care of your patient..Noreene Filbert MD

## 2022-03-19 ENCOUNTER — Telehealth: Payer: Self-pay

## 2022-03-19 NOTE — Telephone Encounter (Signed)
Benefits investigation form faxed for PA on Eligard/Lupron. Awaiting response.

## 2022-03-21 NOTE — Telephone Encounter (Signed)
Medicare primary - No PA Required  [employer] secondary - No PA Required

## 2022-04-17 ENCOUNTER — Other Ambulatory Visit: Payer: Medicare Other | Admitting: Urology

## 2022-04-18 ENCOUNTER — Ambulatory Visit (INDEPENDENT_AMBULATORY_CARE_PROVIDER_SITE_OTHER): Payer: Medicare Other | Admitting: Urology

## 2022-04-18 ENCOUNTER — Encounter: Payer: Self-pay | Admitting: Urology

## 2022-04-18 VITALS — BP 167/82 | HR 59 | Ht 72.0 in | Wt 159.1 lb

## 2022-04-18 DIAGNOSIS — C61 Malignant neoplasm of prostate: Secondary | ICD-10-CM

## 2022-04-18 MED ORDER — LEVOFLOXACIN 500 MG PO TABS
500.0000 mg | ORAL_TABLET | Freq: Once | ORAL | Status: AC
  Administered 2022-04-18: 500 mg via ORAL

## 2022-04-18 MED ORDER — LEUPROLIDE ACETATE (6 MONTH) 45 MG ~~LOC~~ KIT
45.0000 mg | PACK | Freq: Once | SUBCUTANEOUS | Status: AC
  Administered 2022-04-18: 45 mg via SUBCUTANEOUS

## 2022-04-18 MED ORDER — GENTAMICIN SULFATE 40 MG/ML IJ SOLN
80.0000 mg | Freq: Once | INTRAMUSCULAR | Status: AC
  Administered 2022-04-18: 80 mg via INTRAMUSCULAR

## 2022-04-18 NOTE — Progress Notes (Signed)
Eligard SubQ Injection   Due to Prostate Cancer patient is present today for a Eligard Injection.  Medication: Eligard 6 month Dose: '45mg'$   Location: left  Lot: [id number] Exp: 08/2023  Patient tolerated well, no complications were noted.  Performed by: Gordy Clement, [place]  Per Dr. Diamantina Providence patient is to receive a one time dose of ADT. PA approval dates: No PA required.

## 2022-04-22 ENCOUNTER — Ambulatory Visit
Admission: RE | Admit: 2022-04-22 | Discharge: 2022-04-22 | Disposition: A | Discharge status: Home / Self Care | Payer: Medicare Other | Source: Ambulatory Visit | Attending: Radiation Oncology | Admitting: Radiation Oncology

## 2022-04-22 DIAGNOSIS — C61 Malignant neoplasm of prostate: Secondary | ICD-10-CM | POA: Diagnosis present

## 2022-04-23 DIAGNOSIS — C61 Malignant neoplasm of prostate: Secondary | ICD-10-CM | POA: Diagnosis not present

## 2022-04-26 ENCOUNTER — Other Ambulatory Visit: Payer: Self-pay | Admitting: *Deleted

## 2022-04-26 DIAGNOSIS — C61 Malignant neoplasm of prostate: Secondary | ICD-10-CM

## 2022-05-01 ENCOUNTER — Ambulatory Visit
Admission: RE | Admit: 2022-05-01 | Discharge: 2022-05-01 | Disposition: A | Discharge status: Home / Self Care | Payer: Medicare Other | Source: Ambulatory Visit | Attending: Radiation Oncology | Admitting: Radiation Oncology

## 2022-05-01 DIAGNOSIS — C61 Malignant neoplasm of prostate: Secondary | ICD-10-CM | POA: Insufficient documentation

## 2022-05-02 ENCOUNTER — Ambulatory Visit
Admission: RE | Admit: 2022-05-02 | Discharge: 2022-05-02 | Disposition: A | Discharge status: Home / Self Care | Payer: Medicare Other | Source: Ambulatory Visit | Attending: Radiation Oncology | Admitting: Radiation Oncology

## 2022-05-02 ENCOUNTER — Other Ambulatory Visit: Payer: Self-pay

## 2022-05-02 DIAGNOSIS — C61 Malignant neoplasm of prostate: Secondary | ICD-10-CM | POA: Diagnosis not present

## 2022-05-02 LAB — RAD ONC ARIA SESSION SUMMARY
Course Elapsed Days: 0
Plan Fractions Treated to Date: 1
Plan Prescribed Dose Per Fraction: 2 Gy
Plan Total Fractions Prescribed: 40
Plan Total Prescribed Dose: 80 Gy
Reference Point Dosage Given to Date: 2 Gy
Reference Point Session Dosage Given: 2 Gy
Session Number: 1

## 2022-05-03 ENCOUNTER — Ambulatory Visit
Admission: RE | Admit: 2022-05-03 | Discharge: 2022-05-03 | Disposition: A | Discharge status: Home / Self Care | Payer: Medicare Other | Source: Ambulatory Visit | Attending: Radiation Oncology | Admitting: Radiation Oncology

## 2022-05-03 ENCOUNTER — Other Ambulatory Visit: Payer: Self-pay

## 2022-05-03 DIAGNOSIS — C61 Malignant neoplasm of prostate: Secondary | ICD-10-CM | POA: Diagnosis not present

## 2022-05-03 LAB — RAD ONC ARIA SESSION SUMMARY
Course Elapsed Days: 1
Plan Fractions Treated to Date: 2
Plan Prescribed Dose Per Fraction: 2 Gy
Plan Total Fractions Prescribed: 40
Plan Total Prescribed Dose: 80 Gy
Reference Point Dosage Given to Date: 4 Gy
Reference Point Session Dosage Given: 2 Gy
Session Number: 2

## 2022-05-06 ENCOUNTER — Other Ambulatory Visit: Payer: Self-pay

## 2022-05-06 ENCOUNTER — Ambulatory Visit
Admission: RE | Admit: 2022-05-06 | Discharge: 2022-05-06 | Disposition: A | Discharge status: Home / Self Care | Payer: Medicare Other | Source: Ambulatory Visit | Attending: Radiation Oncology | Admitting: Radiation Oncology

## 2022-05-06 DIAGNOSIS — C61 Malignant neoplasm of prostate: Secondary | ICD-10-CM | POA: Diagnosis not present

## 2022-05-06 LAB — RAD ONC ARIA SESSION SUMMARY
Course Elapsed Days: 4
Plan Fractions Treated to Date: 3
Plan Prescribed Dose Per Fraction: 2 Gy
Plan Total Fractions Prescribed: 40
Plan Total Prescribed Dose: 80 Gy
Reference Point Dosage Given to Date: 6 Gy
Reference Point Session Dosage Given: 2 Gy
Session Number: 3

## 2022-05-07 ENCOUNTER — Other Ambulatory Visit: Payer: Self-pay

## 2022-05-07 ENCOUNTER — Ambulatory Visit
Admission: RE | Admit: 2022-05-07 | Discharge: 2022-05-07 | Disposition: A | Discharge status: Home / Self Care | Payer: Medicare Other | Source: Ambulatory Visit | Attending: Radiation Oncology | Admitting: Radiation Oncology

## 2022-05-07 DIAGNOSIS — C61 Malignant neoplasm of prostate: Secondary | ICD-10-CM | POA: Diagnosis not present

## 2022-05-07 LAB — RAD ONC ARIA SESSION SUMMARY
Course Elapsed Days: 5
Plan Fractions Treated to Date: 4
Plan Prescribed Dose Per Fraction: 2 Gy
Plan Total Fractions Prescribed: 40
Plan Total Prescribed Dose: 80 Gy
Reference Point Dosage Given to Date: 8 Gy
Reference Point Session Dosage Given: 2 Gy
Session Number: 4

## 2022-05-08 ENCOUNTER — Other Ambulatory Visit: Payer: Self-pay

## 2022-05-08 ENCOUNTER — Ambulatory Visit
Admission: RE | Admit: 2022-05-08 | Discharge: 2022-05-08 | Disposition: A | Discharge status: Home / Self Care | Payer: Medicare Other | Source: Ambulatory Visit | Attending: Radiation Oncology | Admitting: Radiation Oncology

## 2022-05-08 DIAGNOSIS — C61 Malignant neoplasm of prostate: Secondary | ICD-10-CM | POA: Diagnosis not present

## 2022-05-08 LAB — RAD ONC ARIA SESSION SUMMARY
Course Elapsed Days: 6
Plan Fractions Treated to Date: 5
Plan Prescribed Dose Per Fraction: 2 Gy
Plan Total Fractions Prescribed: 40
Plan Total Prescribed Dose: 80 Gy
Reference Point Dosage Given to Date: 10 Gy
Reference Point Session Dosage Given: 2 Gy
Session Number: 5

## 2022-05-09 ENCOUNTER — Ambulatory Visit
Admission: RE | Admit: 2022-05-09 | Discharge: 2022-05-09 | Disposition: A | Discharge status: Home / Self Care | Payer: Medicare Other | Source: Ambulatory Visit | Attending: Radiation Oncology | Admitting: Radiation Oncology

## 2022-05-09 ENCOUNTER — Other Ambulatory Visit: Payer: Self-pay

## 2022-05-09 DIAGNOSIS — C61 Malignant neoplasm of prostate: Secondary | ICD-10-CM | POA: Diagnosis not present

## 2022-05-09 LAB — RAD ONC ARIA SESSION SUMMARY
Course Elapsed Days: 7
Plan Fractions Treated to Date: 6
Plan Prescribed Dose Per Fraction: 2 Gy
Plan Total Fractions Prescribed: 40
Plan Total Prescribed Dose: 80 Gy
Reference Point Dosage Given to Date: 12 Gy
Reference Point Session Dosage Given: 2 Gy
Session Number: 6

## 2022-05-10 ENCOUNTER — Other Ambulatory Visit: Payer: Self-pay

## 2022-05-10 ENCOUNTER — Ambulatory Visit
Admission: RE | Admit: 2022-05-10 | Discharge: 2022-05-10 | Disposition: A | Discharge status: Home / Self Care | Payer: Medicare Other | Source: Ambulatory Visit | Attending: Radiation Oncology | Admitting: Radiation Oncology

## 2022-05-10 DIAGNOSIS — C61 Malignant neoplasm of prostate: Secondary | ICD-10-CM | POA: Diagnosis not present

## 2022-05-10 LAB — RAD ONC ARIA SESSION SUMMARY
Course Elapsed Days: 8
Plan Fractions Treated to Date: 7
Plan Prescribed Dose Per Fraction: 2 Gy
Plan Total Fractions Prescribed: 40
Plan Total Prescribed Dose: 80 Gy
Reference Point Dosage Given to Date: 14 Gy
Reference Point Session Dosage Given: 2 Gy
Session Number: 7

## 2022-05-13 ENCOUNTER — Other Ambulatory Visit: Payer: Self-pay

## 2022-05-13 ENCOUNTER — Ambulatory Visit
Admission: RE | Admit: 2022-05-13 | Discharge: 2022-05-13 | Disposition: A | Discharge status: Home / Self Care | Payer: Medicare Other | Source: Ambulatory Visit | Attending: Radiation Oncology | Admitting: Radiation Oncology

## 2022-05-13 DIAGNOSIS — C61 Malignant neoplasm of prostate: Secondary | ICD-10-CM | POA: Diagnosis not present

## 2022-05-13 LAB — RAD ONC ARIA SESSION SUMMARY
Course Elapsed Days: 11
Plan Fractions Treated to Date: 8
Plan Prescribed Dose Per Fraction: 2 Gy
Plan Total Fractions Prescribed: 40
Plan Total Prescribed Dose: 80 Gy
Reference Point Dosage Given to Date: 16 Gy
Reference Point Session Dosage Given: 2 Gy
Session Number: 8

## 2022-05-14 ENCOUNTER — Ambulatory Visit
Admission: RE | Admit: 2022-05-14 | Discharge: 2022-05-14 | Disposition: A | Discharge status: Home / Self Care | Payer: Medicare Other | Source: Ambulatory Visit | Attending: Radiation Oncology | Admitting: Radiation Oncology

## 2022-05-14 ENCOUNTER — Other Ambulatory Visit: Payer: Self-pay

## 2022-05-14 DIAGNOSIS — C61 Malignant neoplasm of prostate: Secondary | ICD-10-CM | POA: Diagnosis not present

## 2022-05-14 LAB — RAD ONC ARIA SESSION SUMMARY
Course Elapsed Days: 12
Plan Fractions Treated to Date: 9
Plan Prescribed Dose Per Fraction: 2 Gy
Plan Total Fractions Prescribed: 40
Plan Total Prescribed Dose: 80 Gy
Reference Point Dosage Given to Date: 18 Gy
Reference Point Session Dosage Given: 2 Gy
Session Number: 9

## 2022-05-15 ENCOUNTER — Inpatient Hospital Stay: Payer: Medicare Other | Attending: Oncology

## 2022-05-15 ENCOUNTER — Ambulatory Visit
Admission: RE | Admit: 2022-05-15 | Discharge: 2022-05-15 | Disposition: A | Discharge status: Home / Self Care | Payer: Medicare Other | Source: Ambulatory Visit | Attending: Radiation Oncology | Admitting: Radiation Oncology

## 2022-05-15 ENCOUNTER — Other Ambulatory Visit: Payer: Self-pay

## 2022-05-15 DIAGNOSIS — C61 Malignant neoplasm of prostate: Secondary | ICD-10-CM | POA: Diagnosis not present

## 2022-05-15 LAB — RAD ONC ARIA SESSION SUMMARY
Course Elapsed Days: 13
Plan Fractions Treated to Date: 10
Plan Prescribed Dose Per Fraction: 2 Gy
Plan Total Fractions Prescribed: 40
Plan Total Prescribed Dose: 80 Gy
Reference Point Dosage Given to Date: 20 Gy
Reference Point Session Dosage Given: 2 Gy
Session Number: 10

## 2022-05-16 ENCOUNTER — Ambulatory Visit
Admission: RE | Admit: 2022-05-16 | Discharge: 2022-05-16 | Disposition: A | Discharge status: Home / Self Care | Payer: Medicare Other | Source: Ambulatory Visit | Attending: Radiation Oncology | Admitting: Radiation Oncology

## 2022-05-16 ENCOUNTER — Other Ambulatory Visit: Payer: Self-pay

## 2022-05-16 DIAGNOSIS — C61 Malignant neoplasm of prostate: Secondary | ICD-10-CM | POA: Diagnosis not present

## 2022-05-16 LAB — RAD ONC ARIA SESSION SUMMARY
Course Elapsed Days: 14
Plan Fractions Treated to Date: 11
Plan Prescribed Dose Per Fraction: 2 Gy
Plan Total Fractions Prescribed: 40
Plan Total Prescribed Dose: 80 Gy
Reference Point Dosage Given to Date: 22 Gy
Reference Point Session Dosage Given: 2 Gy
Session Number: 11

## 2022-05-17 ENCOUNTER — Ambulatory Visit: Payer: Medicare Other

## 2022-05-20 ENCOUNTER — Ambulatory Visit
Admission: RE | Admit: 2022-05-20 | Discharge: 2022-05-20 | Disposition: A | Discharge status: Home / Self Care | Payer: Medicare Other | Source: Ambulatory Visit | Attending: Radiation Oncology | Admitting: Radiation Oncology

## 2022-05-20 ENCOUNTER — Other Ambulatory Visit: Payer: Self-pay

## 2022-05-20 DIAGNOSIS — C61 Malignant neoplasm of prostate: Secondary | ICD-10-CM | POA: Diagnosis not present

## 2022-05-20 LAB — RAD ONC ARIA SESSION SUMMARY
Course Elapsed Days: 18
Plan Fractions Treated to Date: 12
Plan Prescribed Dose Per Fraction: 2 Gy
Plan Total Fractions Prescribed: 40
Plan Total Prescribed Dose: 80 Gy
Reference Point Dosage Given to Date: 24 Gy
Reference Point Session Dosage Given: 2 Gy
Session Number: 12

## 2022-05-21 ENCOUNTER — Ambulatory Visit
Admission: RE | Admit: 2022-05-21 | Discharge: 2022-05-21 | Disposition: A | Discharge status: Home / Self Care | Payer: Medicare Other | Source: Ambulatory Visit | Attending: Radiation Oncology | Admitting: Radiation Oncology

## 2022-05-21 ENCOUNTER — Other Ambulatory Visit: Payer: Self-pay

## 2022-05-21 DIAGNOSIS — C61 Malignant neoplasm of prostate: Secondary | ICD-10-CM | POA: Diagnosis not present

## 2022-05-21 LAB — RAD ONC ARIA SESSION SUMMARY
Course Elapsed Days: 19
Plan Fractions Treated to Date: 13
Plan Prescribed Dose Per Fraction: 2 Gy
Plan Total Fractions Prescribed: 40
Plan Total Prescribed Dose: 80 Gy
Reference Point Dosage Given to Date: 26 Gy
Reference Point Session Dosage Given: 2 Gy
Session Number: 13

## 2022-05-22 ENCOUNTER — Other Ambulatory Visit: Payer: Self-pay

## 2022-05-22 ENCOUNTER — Ambulatory Visit
Admission: RE | Admit: 2022-05-22 | Discharge: 2022-05-22 | Disposition: A | Discharge status: Home / Self Care | Payer: Medicare Other | Source: Ambulatory Visit | Attending: Radiation Oncology | Admitting: Radiation Oncology

## 2022-05-22 DIAGNOSIS — C61 Malignant neoplasm of prostate: Secondary | ICD-10-CM | POA: Diagnosis not present

## 2022-05-22 LAB — RAD ONC ARIA SESSION SUMMARY
Course Elapsed Days: 20
Plan Fractions Treated to Date: 14
Plan Prescribed Dose Per Fraction: 2 Gy
Plan Total Fractions Prescribed: 40
Plan Total Prescribed Dose: 80 Gy
Reference Point Dosage Given to Date: 28 Gy
Reference Point Session Dosage Given: 2 Gy
Session Number: 14

## 2022-05-23 ENCOUNTER — Ambulatory Visit
Admission: RE | Admit: 2022-05-23 | Discharge: 2022-05-23 | Disposition: A | Discharge status: Home / Self Care | Payer: Medicare Other | Source: Ambulatory Visit | Attending: Radiation Oncology | Admitting: Radiation Oncology

## 2022-05-23 ENCOUNTER — Other Ambulatory Visit: Payer: Self-pay

## 2022-05-23 DIAGNOSIS — C61 Malignant neoplasm of prostate: Secondary | ICD-10-CM | POA: Diagnosis not present

## 2022-05-23 LAB — RAD ONC ARIA SESSION SUMMARY
Course Elapsed Days: 21
Plan Fractions Treated to Date: 15
Plan Prescribed Dose Per Fraction: 2 Gy
Plan Total Fractions Prescribed: 40
Plan Total Prescribed Dose: 80 Gy
Reference Point Dosage Given to Date: 30 Gy
Reference Point Session Dosage Given: 2 Gy
Session Number: 15

## 2022-05-24 ENCOUNTER — Other Ambulatory Visit: Payer: Self-pay

## 2022-05-24 ENCOUNTER — Other Ambulatory Visit: Payer: Self-pay | Admitting: *Deleted

## 2022-05-24 ENCOUNTER — Ambulatory Visit
Admission: RE | Admit: 2022-05-24 | Discharge: 2022-05-24 | Disposition: A | Discharge status: Home / Self Care | Payer: Medicare Other | Source: Ambulatory Visit | Attending: Radiation Oncology | Admitting: Radiation Oncology

## 2022-05-24 DIAGNOSIS — C61 Malignant neoplasm of prostate: Secondary | ICD-10-CM | POA: Diagnosis not present

## 2022-05-24 LAB — RAD ONC ARIA SESSION SUMMARY
Course Elapsed Days: 22
Plan Fractions Treated to Date: 16
Plan Prescribed Dose Per Fraction: 2 Gy
Plan Total Fractions Prescribed: 40
Plan Total Prescribed Dose: 80 Gy
Reference Point Dosage Given to Date: 32 Gy
Reference Point Session Dosage Given: 2 Gy
Session Number: 16

## 2022-05-27 ENCOUNTER — Other Ambulatory Visit: Payer: Self-pay

## 2022-05-27 ENCOUNTER — Ambulatory Visit
Admission: RE | Admit: 2022-05-27 | Discharge: 2022-05-27 | Disposition: A | Discharge status: Home / Self Care | Payer: Medicare Other | Source: Ambulatory Visit | Attending: Radiation Oncology | Admitting: Radiation Oncology

## 2022-05-27 DIAGNOSIS — C61 Malignant neoplasm of prostate: Secondary | ICD-10-CM | POA: Diagnosis not present

## 2022-05-27 LAB — RAD ONC ARIA SESSION SUMMARY
Course Elapsed Days: 25
Plan Fractions Treated to Date: 17
Plan Prescribed Dose Per Fraction: 2 Gy
Plan Total Fractions Prescribed: 40
Plan Total Prescribed Dose: 80 Gy
Reference Point Dosage Given to Date: 34 Gy
Reference Point Session Dosage Given: 2 Gy
Session Number: 17

## 2022-05-28 ENCOUNTER — Ambulatory Visit
Admission: RE | Admit: 2022-05-28 | Discharge: 2022-05-28 | Disposition: A | Discharge status: Home / Self Care | Payer: Medicare Other | Source: Ambulatory Visit | Attending: Radiation Oncology | Admitting: Radiation Oncology

## 2022-05-28 ENCOUNTER — Other Ambulatory Visit: Payer: Self-pay

## 2022-05-28 DIAGNOSIS — C61 Malignant neoplasm of prostate: Secondary | ICD-10-CM | POA: Diagnosis present

## 2022-05-28 LAB — RAD ONC ARIA SESSION SUMMARY
Course Elapsed Days: 26
Plan Fractions Treated to Date: 18
Plan Prescribed Dose Per Fraction: 2 Gy
Plan Total Fractions Prescribed: 40
Plan Total Prescribed Dose: 80 Gy
Reference Point Dosage Given to Date: 36 Gy
Reference Point Session Dosage Given: 2 Gy
Session Number: 18

## 2022-05-29 ENCOUNTER — Ambulatory Visit
Admission: RE | Admit: 2022-05-29 | Discharge: 2022-05-29 | Disposition: A | Discharge status: Home / Self Care | Payer: Medicare Other | Source: Ambulatory Visit | Attending: Radiation Oncology | Admitting: Radiation Oncology

## 2022-05-29 ENCOUNTER — Other Ambulatory Visit: Payer: Self-pay

## 2022-05-29 ENCOUNTER — Inpatient Hospital Stay: Payer: Medicare Other | Attending: Oncology

## 2022-05-29 DIAGNOSIS — C61 Malignant neoplasm of prostate: Secondary | ICD-10-CM | POA: Diagnosis not present

## 2022-05-29 LAB — RAD ONC ARIA SESSION SUMMARY
Course Elapsed Days: 27
Plan Fractions Treated to Date: 19
Plan Prescribed Dose Per Fraction: 2 Gy
Plan Total Fractions Prescribed: 40
Plan Total Prescribed Dose: 80 Gy
Reference Point Dosage Given to Date: 38 Gy
Reference Point Session Dosage Given: 2 Gy
Session Number: 19

## 2022-05-30 ENCOUNTER — Other Ambulatory Visit: Payer: Self-pay

## 2022-05-30 ENCOUNTER — Ambulatory Visit
Admission: RE | Admit: 2022-05-30 | Discharge: 2022-05-30 | Disposition: A | Discharge status: Home / Self Care | Payer: Medicare Other | Source: Ambulatory Visit | Attending: Radiation Oncology | Admitting: Radiation Oncology

## 2022-05-30 DIAGNOSIS — C61 Malignant neoplasm of prostate: Secondary | ICD-10-CM | POA: Diagnosis not present

## 2022-05-30 LAB — RAD ONC ARIA SESSION SUMMARY
Course Elapsed Days: 28
Plan Fractions Treated to Date: 20
Plan Prescribed Dose Per Fraction: 2 Gy
Plan Total Fractions Prescribed: 40
Plan Total Prescribed Dose: 80 Gy
Reference Point Dosage Given to Date: 40 Gy
Reference Point Session Dosage Given: 2 Gy
Session Number: 20

## 2022-05-31 ENCOUNTER — Ambulatory Visit
Admission: RE | Admit: 2022-05-31 | Discharge: 2022-05-31 | Disposition: A | Discharge status: Home / Self Care | Payer: Medicare Other | Source: Ambulatory Visit | Attending: Radiation Oncology | Admitting: Radiation Oncology

## 2022-05-31 ENCOUNTER — Other Ambulatory Visit: Payer: Self-pay

## 2022-05-31 DIAGNOSIS — C61 Malignant neoplasm of prostate: Secondary | ICD-10-CM | POA: Diagnosis not present

## 2022-05-31 LAB — RAD ONC ARIA SESSION SUMMARY
Course Elapsed Days: 29
Plan Fractions Treated to Date: 21
Plan Prescribed Dose Per Fraction: 2 Gy
Plan Total Fractions Prescribed: 40
Plan Total Prescribed Dose: 80 Gy
Reference Point Dosage Given to Date: 42 Gy
Reference Point Session Dosage Given: 2 Gy
Session Number: 21

## 2022-06-03 ENCOUNTER — Ambulatory Visit
Admission: RE | Admit: 2022-06-03 | Discharge: 2022-06-03 | Disposition: A | Discharge status: Home / Self Care | Payer: Medicare Other | Source: Ambulatory Visit | Attending: Radiation Oncology | Admitting: Radiation Oncology

## 2022-06-03 ENCOUNTER — Other Ambulatory Visit: Payer: Self-pay

## 2022-06-03 DIAGNOSIS — C61 Malignant neoplasm of prostate: Secondary | ICD-10-CM | POA: Diagnosis not present

## 2022-06-03 LAB — RAD ONC ARIA SESSION SUMMARY
Course Elapsed Days: 32
Plan Fractions Treated to Date: 22
Plan Prescribed Dose Per Fraction: 2 Gy
Plan Total Fractions Prescribed: 40
Plan Total Prescribed Dose: 80 Gy
Reference Point Dosage Given to Date: 44 Gy
Reference Point Session Dosage Given: 2 Gy
Session Number: 22

## 2022-06-04 ENCOUNTER — Other Ambulatory Visit: Payer: Self-pay

## 2022-06-04 ENCOUNTER — Ambulatory Visit
Admission: RE | Admit: 2022-06-04 | Discharge: 2022-06-04 | Disposition: A | Discharge status: Home / Self Care | Payer: Medicare Other | Source: Ambulatory Visit | Attending: Radiation Oncology | Admitting: Radiation Oncology

## 2022-06-04 DIAGNOSIS — C61 Malignant neoplasm of prostate: Secondary | ICD-10-CM | POA: Diagnosis not present

## 2022-06-04 LAB — RAD ONC ARIA SESSION SUMMARY
Course Elapsed Days: 33
Plan Fractions Treated to Date: 23
Plan Prescribed Dose Per Fraction: 2 Gy
Plan Total Fractions Prescribed: 40
Plan Total Prescribed Dose: 80 Gy
Reference Point Dosage Given to Date: 46 Gy
Reference Point Session Dosage Given: 2 Gy
Session Number: 23

## 2022-06-05 ENCOUNTER — Other Ambulatory Visit: Payer: Self-pay

## 2022-06-05 ENCOUNTER — Ambulatory Visit
Admission: RE | Admit: 2022-06-05 | Discharge: 2022-06-05 | Disposition: A | Discharge status: Home / Self Care | Payer: Medicare Other | Source: Ambulatory Visit | Attending: Radiation Oncology | Admitting: Radiation Oncology

## 2022-06-05 DIAGNOSIS — C61 Malignant neoplasm of prostate: Secondary | ICD-10-CM | POA: Diagnosis not present

## 2022-06-05 LAB — RAD ONC ARIA SESSION SUMMARY
Course Elapsed Days: 34
Plan Fractions Treated to Date: 24
Plan Prescribed Dose Per Fraction: 2 Gy
Plan Total Fractions Prescribed: 40
Plan Total Prescribed Dose: 80 Gy
Reference Point Dosage Given to Date: 48 Gy
Reference Point Session Dosage Given: 2 Gy
Session Number: 24

## 2022-06-06 ENCOUNTER — Other Ambulatory Visit: Payer: Self-pay

## 2022-06-06 ENCOUNTER — Ambulatory Visit
Admission: RE | Admit: 2022-06-06 | Discharge: 2022-06-06 | Disposition: A | Discharge status: Home / Self Care | Payer: Medicare Other | Source: Ambulatory Visit | Attending: Radiation Oncology | Admitting: Radiation Oncology

## 2022-06-06 DIAGNOSIS — C61 Malignant neoplasm of prostate: Secondary | ICD-10-CM | POA: Diagnosis not present

## 2022-06-06 LAB — RAD ONC ARIA SESSION SUMMARY
Course Elapsed Days: 35
Plan Fractions Treated to Date: 25
Plan Prescribed Dose Per Fraction: 2 Gy
Plan Total Fractions Prescribed: 40
Plan Total Prescribed Dose: 80 Gy
Reference Point Dosage Given to Date: 50 Gy
Reference Point Session Dosage Given: 2 Gy
Session Number: 25

## 2022-06-07 ENCOUNTER — Ambulatory Visit
Admission: RE | Admit: 2022-06-07 | Discharge: 2022-06-07 | Disposition: A | Discharge status: Home / Self Care | Payer: Medicare Other | Source: Ambulatory Visit | Attending: Radiation Oncology | Admitting: Radiation Oncology

## 2022-06-07 ENCOUNTER — Other Ambulatory Visit: Payer: Self-pay

## 2022-06-07 DIAGNOSIS — C61 Malignant neoplasm of prostate: Secondary | ICD-10-CM | POA: Diagnosis not present

## 2022-06-07 LAB — RAD ONC ARIA SESSION SUMMARY
Course Elapsed Days: 36
Plan Fractions Treated to Date: 26
Plan Prescribed Dose Per Fraction: 2 Gy
Plan Total Fractions Prescribed: 40
Plan Total Prescribed Dose: 80 Gy
Reference Point Dosage Given to Date: 52 Gy
Reference Point Session Dosage Given: 2 Gy
Session Number: 26

## 2022-06-10 ENCOUNTER — Other Ambulatory Visit: Payer: Self-pay

## 2022-06-10 ENCOUNTER — Ambulatory Visit
Admission: RE | Admit: 2022-06-10 | Discharge: 2022-06-10 | Disposition: A | Discharge status: Home / Self Care | Payer: Medicare Other | Source: Ambulatory Visit | Attending: Radiation Oncology | Admitting: Radiation Oncology

## 2022-06-10 DIAGNOSIS — C61 Malignant neoplasm of prostate: Secondary | ICD-10-CM | POA: Diagnosis not present

## 2022-06-10 LAB — RAD ONC ARIA SESSION SUMMARY
Course Elapsed Days: 39
Plan Fractions Treated to Date: 27
Plan Prescribed Dose Per Fraction: 2 Gy
Plan Total Fractions Prescribed: 40
Plan Total Prescribed Dose: 80 Gy
Reference Point Dosage Given to Date: 54 Gy
Reference Point Session Dosage Given: 2 Gy
Session Number: 27

## 2022-06-11 ENCOUNTER — Other Ambulatory Visit: Payer: Self-pay

## 2022-06-11 ENCOUNTER — Ambulatory Visit
Admission: RE | Admit: 2022-06-11 | Discharge: 2022-06-11 | Disposition: A | Discharge status: Home / Self Care | Payer: Medicare Other | Source: Ambulatory Visit | Attending: Radiation Oncology | Admitting: Radiation Oncology

## 2022-06-11 DIAGNOSIS — C61 Malignant neoplasm of prostate: Secondary | ICD-10-CM | POA: Diagnosis not present

## 2022-06-11 LAB — RAD ONC ARIA SESSION SUMMARY
Course Elapsed Days: 40
Plan Fractions Treated to Date: 28
Plan Prescribed Dose Per Fraction: 2 Gy
Plan Total Fractions Prescribed: 40
Plan Total Prescribed Dose: 80 Gy
Reference Point Dosage Given to Date: 56 Gy
Reference Point Session Dosage Given: 2 Gy
Session Number: 28

## 2022-06-12 ENCOUNTER — Ambulatory Visit
Admission: RE | Admit: 2022-06-12 | Discharge: 2022-06-12 | Disposition: A | Discharge status: Home / Self Care | Payer: Medicare Other | Source: Ambulatory Visit | Attending: Radiation Oncology | Admitting: Radiation Oncology

## 2022-06-12 ENCOUNTER — Other Ambulatory Visit: Payer: Self-pay

## 2022-06-12 ENCOUNTER — Inpatient Hospital Stay: Payer: Medicare Other

## 2022-06-12 DIAGNOSIS — C61 Malignant neoplasm of prostate: Secondary | ICD-10-CM | POA: Diagnosis not present

## 2022-06-12 LAB — RAD ONC ARIA SESSION SUMMARY
Course Elapsed Days: 41
Plan Fractions Treated to Date: 29
Plan Prescribed Dose Per Fraction: 2 Gy
Plan Total Fractions Prescribed: 40
Plan Total Prescribed Dose: 80 Gy
Reference Point Dosage Given to Date: 58 Gy
Reference Point Session Dosage Given: 2 Gy
Session Number: 29

## 2022-06-13 ENCOUNTER — Other Ambulatory Visit: Payer: Self-pay

## 2022-06-13 ENCOUNTER — Ambulatory Visit
Admission: RE | Admit: 2022-06-13 | Discharge: 2022-06-13 | Disposition: A | Discharge status: Home / Self Care | Payer: Medicare Other | Source: Ambulatory Visit | Attending: Radiation Oncology | Admitting: Radiation Oncology

## 2022-06-13 DIAGNOSIS — C61 Malignant neoplasm of prostate: Secondary | ICD-10-CM | POA: Diagnosis not present

## 2022-06-13 LAB — RAD ONC ARIA SESSION SUMMARY
Course Elapsed Days: 42
Plan Fractions Treated to Date: 30
Plan Prescribed Dose Per Fraction: 2 Gy
Plan Total Fractions Prescribed: 40
Plan Total Prescribed Dose: 80 Gy
Reference Point Dosage Given to Date: 60 Gy
Reference Point Session Dosage Given: 2 Gy
Session Number: 30

## 2022-06-14 ENCOUNTER — Ambulatory Visit
Admission: RE | Admit: 2022-06-14 | Discharge: 2022-06-14 | Disposition: A | Discharge status: Home / Self Care | Payer: Medicare Other | Source: Ambulatory Visit | Attending: Radiation Oncology | Admitting: Radiation Oncology

## 2022-06-14 ENCOUNTER — Other Ambulatory Visit: Payer: Self-pay

## 2022-06-14 DIAGNOSIS — C61 Malignant neoplasm of prostate: Secondary | ICD-10-CM | POA: Diagnosis not present

## 2022-06-14 LAB — RAD ONC ARIA SESSION SUMMARY
Course Elapsed Days: 43
Plan Fractions Treated to Date: 31
Plan Prescribed Dose Per Fraction: 2 Gy
Plan Total Fractions Prescribed: 40
Plan Total Prescribed Dose: 80 Gy
Reference Point Dosage Given to Date: 62 Gy
Reference Point Session Dosage Given: 2 Gy
Session Number: 31

## 2022-06-17 ENCOUNTER — Ambulatory Visit
Admission: RE | Admit: 2022-06-17 | Discharge: 2022-06-17 | Disposition: A | Discharge status: Home / Self Care | Payer: Medicare Other | Source: Ambulatory Visit | Attending: Radiation Oncology | Admitting: Radiation Oncology

## 2022-06-17 ENCOUNTER — Other Ambulatory Visit: Payer: Self-pay

## 2022-06-17 DIAGNOSIS — C61 Malignant neoplasm of prostate: Secondary | ICD-10-CM | POA: Diagnosis not present

## 2022-06-17 LAB — RAD ONC ARIA SESSION SUMMARY
Course Elapsed Days: 46
Plan Fractions Treated to Date: 32
Plan Prescribed Dose Per Fraction: 2 Gy
Plan Total Fractions Prescribed: 40
Plan Total Prescribed Dose: 80 Gy
Reference Point Dosage Given to Date: 64 Gy
Reference Point Session Dosage Given: 2 Gy
Session Number: 32

## 2022-06-18 ENCOUNTER — Ambulatory Visit
Admission: RE | Admit: 2022-06-18 | Discharge: 2022-06-18 | Disposition: A | Discharge status: Home / Self Care | Payer: Medicare Other | Source: Ambulatory Visit | Attending: Radiation Oncology | Admitting: Radiation Oncology

## 2022-06-18 ENCOUNTER — Other Ambulatory Visit: Payer: Self-pay

## 2022-06-18 DIAGNOSIS — C61 Malignant neoplasm of prostate: Secondary | ICD-10-CM | POA: Diagnosis not present

## 2022-06-18 LAB — RAD ONC ARIA SESSION SUMMARY
Course Elapsed Days: 47
Plan Fractions Treated to Date: 33
Plan Prescribed Dose Per Fraction: 2 Gy
Plan Total Fractions Prescribed: 40
Plan Total Prescribed Dose: 80 Gy
Reference Point Dosage Given to Date: 66 Gy
Reference Point Session Dosage Given: 2 Gy
Session Number: 33

## 2022-06-19 ENCOUNTER — Other Ambulatory Visit: Payer: Self-pay

## 2022-06-19 ENCOUNTER — Ambulatory Visit
Admission: RE | Admit: 2022-06-19 | Discharge: 2022-06-19 | Disposition: A | Discharge status: Home / Self Care | Payer: Medicare Other | Source: Ambulatory Visit | Attending: Radiation Oncology | Admitting: Radiation Oncology

## 2022-06-19 DIAGNOSIS — C61 Malignant neoplasm of prostate: Secondary | ICD-10-CM | POA: Diagnosis not present

## 2022-06-19 LAB — RAD ONC ARIA SESSION SUMMARY
Course Elapsed Days: 48
Plan Fractions Treated to Date: 34
Plan Prescribed Dose Per Fraction: 2 Gy
Plan Total Fractions Prescribed: 40
Plan Total Prescribed Dose: 80 Gy
Reference Point Dosage Given to Date: 68 Gy
Reference Point Session Dosage Given: 2 Gy
Session Number: 34

## 2022-06-20 ENCOUNTER — Other Ambulatory Visit: Payer: Self-pay

## 2022-06-20 ENCOUNTER — Ambulatory Visit
Admission: RE | Admit: 2022-06-20 | Discharge: 2022-06-20 | Disposition: A | Discharge status: Home / Self Care | Payer: Medicare Other | Source: Ambulatory Visit | Attending: Radiation Oncology | Admitting: Radiation Oncology

## 2022-06-20 DIAGNOSIS — C61 Malignant neoplasm of prostate: Secondary | ICD-10-CM | POA: Diagnosis not present

## 2022-06-20 LAB — RAD ONC ARIA SESSION SUMMARY
Course Elapsed Days: 49
Plan Fractions Treated to Date: 35
Plan Prescribed Dose Per Fraction: 2 Gy
Plan Total Fractions Prescribed: 40
Plan Total Prescribed Dose: 80 Gy
Reference Point Dosage Given to Date: 70 Gy
Reference Point Session Dosage Given: 2 Gy
Session Number: 35

## 2022-06-21 ENCOUNTER — Other Ambulatory Visit: Payer: Self-pay

## 2022-06-21 ENCOUNTER — Ambulatory Visit
Admission: RE | Admit: 2022-06-21 | Discharge: 2022-06-21 | Disposition: A | Discharge status: Home / Self Care | Payer: Medicare Other | Source: Ambulatory Visit | Attending: Radiation Oncology | Admitting: Radiation Oncology

## 2022-06-21 DIAGNOSIS — C61 Malignant neoplasm of prostate: Secondary | ICD-10-CM | POA: Diagnosis not present

## 2022-06-21 LAB — RAD ONC ARIA SESSION SUMMARY
Course Elapsed Days: 50
Plan Fractions Treated to Date: 36
Plan Prescribed Dose Per Fraction: 2 Gy
Plan Total Fractions Prescribed: 40
Plan Total Prescribed Dose: 80 Gy
Reference Point Dosage Given to Date: 72 Gy
Reference Point Session Dosage Given: 2 Gy
Session Number: 36

## 2022-06-24 ENCOUNTER — Other Ambulatory Visit: Payer: Self-pay

## 2022-06-24 ENCOUNTER — Ambulatory Visit
Admission: RE | Admit: 2022-06-24 | Discharge: 2022-06-24 | Disposition: A | Discharge status: Home / Self Care | Payer: Medicare Other | Source: Ambulatory Visit | Attending: Radiation Oncology | Admitting: Radiation Oncology

## 2022-06-24 DIAGNOSIS — C61 Malignant neoplasm of prostate: Secondary | ICD-10-CM | POA: Diagnosis not present

## 2022-06-24 LAB — RAD ONC ARIA SESSION SUMMARY
Course Elapsed Days: 53
Plan Fractions Treated to Date: 37
Plan Prescribed Dose Per Fraction: 2 Gy
Plan Total Fractions Prescribed: 40
Plan Total Prescribed Dose: 80 Gy
Reference Point Dosage Given to Date: 74 Gy
Reference Point Session Dosage Given: 2 Gy
Session Number: 37

## 2022-06-25 ENCOUNTER — Ambulatory Visit
Admission: RE | Admit: 2022-06-25 | Discharge: 2022-06-25 | Disposition: A | Discharge status: Home / Self Care | Payer: Medicare Other | Source: Ambulatory Visit | Attending: Radiation Oncology | Admitting: Radiation Oncology

## 2022-06-25 ENCOUNTER — Other Ambulatory Visit: Payer: Self-pay

## 2022-06-25 DIAGNOSIS — C61 Malignant neoplasm of prostate: Secondary | ICD-10-CM | POA: Diagnosis not present

## 2022-06-25 LAB — RAD ONC ARIA SESSION SUMMARY
Course Elapsed Days: 54
Plan Fractions Treated to Date: 38
Plan Prescribed Dose Per Fraction: 2 Gy
Plan Total Fractions Prescribed: 40
Plan Total Prescribed Dose: 80 Gy
Reference Point Dosage Given to Date: 76 Gy
Reference Point Session Dosage Given: 2 Gy
Session Number: 38

## 2022-06-26 ENCOUNTER — Other Ambulatory Visit: Payer: Self-pay

## 2022-06-26 ENCOUNTER — Ambulatory Visit
Admission: RE | Admit: 2022-06-26 | Discharge: 2022-06-26 | Disposition: A | Discharge status: Home / Self Care | Payer: Medicare Other | Source: Ambulatory Visit | Attending: Radiation Oncology | Admitting: Radiation Oncology

## 2022-06-26 DIAGNOSIS — C61 Malignant neoplasm of prostate: Secondary | ICD-10-CM | POA: Diagnosis not present

## 2022-06-26 LAB — RAD ONC ARIA SESSION SUMMARY
Course Elapsed Days: 55
Plan Fractions Treated to Date: 39
Plan Prescribed Dose Per Fraction: 2 Gy
Plan Total Fractions Prescribed: 40
Plan Total Prescribed Dose: 80 Gy
Reference Point Dosage Given to Date: 78 Gy
Reference Point Session Dosage Given: 2 Gy
Session Number: 39

## 2022-06-27 ENCOUNTER — Ambulatory Visit
Admission: RE | Admit: 2022-06-27 | Discharge: 2022-06-27 | Disposition: A | Discharge status: Home / Self Care | Payer: Medicare Other | Source: Ambulatory Visit | Attending: Radiation Oncology | Admitting: Radiation Oncology

## 2022-06-27 ENCOUNTER — Other Ambulatory Visit: Payer: Self-pay

## 2022-06-27 DIAGNOSIS — C61 Malignant neoplasm of prostate: Secondary | ICD-10-CM | POA: Diagnosis not present

## 2022-06-27 LAB — RAD ONC ARIA SESSION SUMMARY
Course Elapsed Days: 56
Plan Fractions Treated to Date: 40
Plan Prescribed Dose Per Fraction: 2 Gy
Plan Total Fractions Prescribed: 40
Plan Total Prescribed Dose: 80 Gy
Reference Point Dosage Given to Date: 80 Gy
Reference Point Session Dosage Given: 2 Gy
Session Number: 40

## 2022-07-24 ENCOUNTER — Other Ambulatory Visit: Payer: Self-pay | Admitting: *Deleted

## 2022-07-24 ENCOUNTER — Ambulatory Visit
Admission: RE | Admit: 2022-07-24 | Discharge: 2022-07-24 | Disposition: A | Discharge status: Home / Self Care | Payer: Medicare Other | Source: Ambulatory Visit | Attending: Radiation Oncology | Admitting: Radiation Oncology

## 2022-07-24 ENCOUNTER — Encounter: Payer: Self-pay | Admitting: Radiation Oncology

## 2022-07-24 VITALS — BP 174/97 | HR 66 | Temp 98.3°F | Resp 16 | Ht 75.0 in | Wt 157.6 lb

## 2022-07-24 DIAGNOSIS — C61 Malignant neoplasm of prostate: Secondary | ICD-10-CM | POA: Insufficient documentation

## 2022-07-24 MED ORDER — SILDENAFIL CITRATE 20 MG PO TABS: 20.0000 mg | ORAL_TABLET | ORAL | 0 refills | Status: DC | PRN

## 2022-07-24 NOTE — Progress Notes (Signed)
Patient here for follow up BP elevated today patient advised to follow up with PCP for BP control. Patient verbalized understanding.

## 2022-07-24 NOTE — Progress Notes (Signed)
Radiation Oncology Follow up Note  Name: Tyler Myers   Date:   07/24/2022 MRN:  [id number] DOB: December 01, 1950    This 70 y.o. male presents to the clinic today for 1 month follow-up status post image guided IMRT radiation therapy to his prostate for Gleason 7 (4+3) adenocarcinoma the prostate presenting with a PSA of 12.5.  REFERRING PROVIDER: [facility]*  HPI: Patient is a 71 year old male now seen at 1 month having completed image guided IMRT radiation therapy to his prostate for Gleason 7 (4+3) adenocarcinoma the prostate presenting with a PSA of 12.4 seen today in routine follow-up he is doing well no real increased lower urinary tract symptoms still has nocturia x2-3.  No diarrhea.  He is having some erectile dysfunction and has requested Viagra for that..  COMPLICATIONS OF TREATMENT: none  FOLLOW UP COMPLIANCE: keeps appointments   PHYSICAL EXAM:  BP (!) 174/97 (BP Location: Right Arm, Patient Position: Sitting, Cuff Size: Normal)   Pulse 66   Temp 98.3 F (36.8 C) (Tympanic)   Resp 16   Ht '6\' 3"'$  (1.905 m) Comment: stated Ht  Wt 157 lb 9.6 oz (71.5 kg)   BMI 19.70 kg/m  Well-developed well-nourished patient in NAD. HEENT reveals PERLA, EOMI, discs not visualized.  Oral cavity is clear. No oral mucosal lesions are identified. Neck is clear without evidence of cervical or supraclavicular adenopathy. Lungs are clear to A&P. Cardiac examination is essentially unremarkable with regular rate and rhythm without murmur rub or thrill. Abdomen is benign with no organomegaly or masses noted. Motor sensory and DTR levels are equal and symmetric in the upper and lower extremities. Cranial nerves II through XII are grossly intact. Proprioception is intact. No peripheral adenopathy or edema is identified. No motor or sensory levels are noted. Crude visual fields are within normal range.  RADIOLOGY RESULTS: No current films for review  PLAN: Present time patient is doing well very  low side effect profile from his external beam IMRT radiation therapy.  I have asked to see him back in 3 months for follow-up with a PSA prior to that visit.  I am also prescribing some generic Viagra.  Patient knows to call with any concerns at any time.  I would like to take this opportunity to thank you for allowing me to participate in the care of your patient.Noreene Filbert, MD

## 2022-09-04 ENCOUNTER — Other Ambulatory Visit: Payer: Self-pay | Admitting: Family Medicine

## 2022-09-04 DIAGNOSIS — F17200 Nicotine dependence, unspecified, uncomplicated: Secondary | ICD-10-CM

## 2022-09-04 DIAGNOSIS — Z136 Encounter for screening for cardiovascular disorders: Secondary | ICD-10-CM

## 2022-09-04 DIAGNOSIS — F17219 Nicotine dependence, cigarettes, with unspecified nicotine-induced disorders: Secondary | ICD-10-CM

## 2022-09-06 ENCOUNTER — Ambulatory Visit
Admission: RE | Admit: 2022-09-06 | Discharge: 2022-09-06 | Disposition: A | Discharge status: Home / Self Care | Payer: Medicare Other | Source: Ambulatory Visit | Attending: Family Medicine | Admitting: Family Medicine

## 2022-09-06 DIAGNOSIS — F17219 Nicotine dependence, cigarettes, with unspecified nicotine-induced disorders: Secondary | ICD-10-CM | POA: Diagnosis present

## 2022-09-06 DIAGNOSIS — F17200 Nicotine dependence, unspecified, uncomplicated: Secondary | ICD-10-CM

## 2022-09-06 DIAGNOSIS — Z136 Encounter for screening for cardiovascular disorders: Secondary | ICD-10-CM | POA: Diagnosis present

## 2022-10-22 ENCOUNTER — Inpatient Hospital Stay: Payer: Medicare Other | Attending: Radiation Oncology

## 2022-10-22 DIAGNOSIS — C61 Malignant neoplasm of prostate: Secondary | ICD-10-CM | POA: Diagnosis not present

## 2022-10-23 LAB — PSA: Prostatic Specific Antigen: 2.37 ng/mL (ref 0.00–4.00)

## 2022-10-24 ENCOUNTER — Ambulatory Visit: Payer: Medicare Other | Admitting: Urology

## 2022-10-24 ENCOUNTER — Encounter: Payer: Self-pay | Admitting: Urology

## 2022-10-29 ENCOUNTER — Ambulatory Visit: Payer: Medicare Other | Attending: Radiation Oncology | Admitting: Radiation Oncology

## 2022-12-05 ENCOUNTER — Ambulatory Visit: Payer: Medicare Other | Admitting: Urology

## 2023-01-08 ENCOUNTER — Ambulatory Visit (INDEPENDENT_AMBULATORY_CARE_PROVIDER_SITE_OTHER): Payer: Medicare Other | Admitting: Urology

## 2023-01-08 ENCOUNTER — Encounter: Payer: Self-pay | Admitting: Urology

## 2023-01-08 VITALS — BP 134/80 | HR 52 | Ht 74.0 in | Wt 150.0 lb

## 2023-01-08 DIAGNOSIS — N529 Male erectile dysfunction, unspecified: Secondary | ICD-10-CM

## 2023-01-08 DIAGNOSIS — C61 Malignant neoplasm of prostate: Secondary | ICD-10-CM | POA: Diagnosis not present

## 2023-01-08 MED ORDER — SILDENAFIL CITRATE 20 MG PO TABS: 20.0000 mg | ORAL_TABLET | ORAL | 6 refills | Status: DC | PRN

## 2023-01-08 MED ORDER — SILDENAFIL CITRATE 20 MG PO TABS: 20.0000 mg | ORAL_TABLET | ORAL | 6 refills | Status: AC | PRN

## 2023-01-08 NOTE — Progress Notes (Signed)
   01/08/2023 12:25 PM   Tyler Myers January 31, 1951 [id number]  Reason for visit: Follow up prostate cancer, ED  HPI: 72 year old male with significant alcohol abuse who presented with a persistently rising PSA up to 12.4, and prostate MRI showed PI-RADS 4 lesions of the right anterior transition zone in the mid gland, as well as the right anterior peripheral zone in the mid gland.  He underwent a fusion biopsy in [place] which showed Gleason score 4+3=7 disease in the regions of interest with 90% max core involvement.  Additionally, there were 2 cores of Gleason score 3+3=6 involving 5% at the right apex.   He opted for 6 months ADT(injection given 04/18/2022) and external beam radiation that was completed August 2023.  He tolerated radiation well and really denies any complaints today.  He was started on sildenafil 20 mg as needed PRN for ED by Dr. Baruch Gouty which has worked well.  We discussed the maximum dose would be 100 mg, and prescription was updated/refilled.  He denies any urinary symptoms aside from frequency with high alcohol intake.  He originally was having some hot flashes from the ADT but those have continued to improve over the last few months.  PSA has decreased appropriately, most recently 2.37 and December 2023.  We reviewed the importance of monitoring the PSA trend, concept of PSA bounce, need to monitor PSA moving forward.  Sildenafil increased to 20 to 100 mg on demand, refilled RTC 6 months PSA prior  Billey Co, MD  [facility] [address], [place], [place] [address] [phone]

## 2023-07-07 ENCOUNTER — Other Ambulatory Visit: Payer: Medicare Other

## 2023-07-09 ENCOUNTER — Ambulatory Visit: Payer: BLUE CROSS/BLUE SHIELD | Admitting: Urology

## 2023-09-01 ENCOUNTER — Other Ambulatory Visit: Payer: Medicare Other

## 2023-09-04 ENCOUNTER — Encounter: Payer: Self-pay | Admitting: Urology

## 2023-09-04 ENCOUNTER — Ambulatory Visit (INDEPENDENT_AMBULATORY_CARE_PROVIDER_SITE_OTHER): Payer: Medicare Other | Admitting: Urology

## 2023-09-04 VITALS — BP 158/88 | HR 51 | Ht 74.0 in | Wt 170.6 lb

## 2023-09-04 DIAGNOSIS — N529 Male erectile dysfunction, unspecified: Secondary | ICD-10-CM | POA: Diagnosis not present

## 2023-09-04 DIAGNOSIS — C61 Malignant neoplasm of prostate: Secondary | ICD-10-CM

## 2023-09-04 DIAGNOSIS — Z8546 Personal history of malignant neoplasm of prostate: Secondary | ICD-10-CM

## 2023-09-04 MED ORDER — SILDENAFIL CITRATE 100 MG PO TABS: 50.0000 mg | ORAL_TABLET | Freq: Every day | ORAL | 6 refills | Status: AC | PRN

## 2023-09-04 NOTE — Patient Instructions (Signed)
Diarrhea, Adult Diarrhea is when you pass loose and sometimes watery poop (stool) often. Diarrhea can make you feel weak and cause you to lose water in your body (get dehydrated). Losing water in your body can cause you to: Feel tired and thirsty. Have a dry mouth. Go pee (urinate) less often. Diarrhea often lasts 2-3 days. It can last longer if it is a sign of something more serious. Be sure to treat your diarrhea as told by your doctor. Follow these instructions at home: Eating and drinking     Follow these instructions as told by your doctor: Take an ORS (oral rehydration solution). This is a drink that helps you replace fluids and minerals your body lost. It is sold at pharmacies and stores. Drink enough fluid to keep your pee (urine) pale yellow. Drink fluids such as: Water. You can also get fluids by sucking on ice chips. Diluted fruit juice. Low-calorie sports drinks. Milk. Avoid drinking fluids that have a lot of sugar or caffeine in them. These include soda, energy drinks, and regular sports drinks. Avoid alcohol. Eat bland, easy-to-digest foods in small amounts as you are able. These foods include: Bananas. Applesauce. Rice. Low-fat (lean) meats. Toast. Crackers. Avoid spicy or fatty foods.  Medicines Take over-the-counter and prescription medicines only as told by your doctor. If you were prescribed antibiotics, take them as told by your doctor. Do not stop taking them even if you start to feel better. General instructions  Wash your hands often using soap and water for 20 seconds. If soap and water are not available, use hand sanitizer. Others in your home should wash their hands as well. Wash your hands: After using the toilet or changing a diaper. Before preparing, cooking, or serving food. While caring for a sick person. While visiting someone in a hospital. Rest at home while you get better. Take a warm bath to help with any burning or pain from having  diarrhea. Watch your condition for any changes. Contact a doctor if: You have a fever. Your diarrhea gets worse. You have new symptoms. You vomit every time you eat or drink. You feel light-headed, dizzy, or you have a headache. You have muscle cramps. You have signs of losing too much water in your body, such as: Dark pee, very little pee, or no pee. Cracked lips. Dry mouth. Sunken eyes. Sleepiness. Weakness. You have bloody or black poop or poop that looks like tar. You have very bad pain, cramping, or bloating in your belly (abdomen). Your skin feels cold and clammy. You feel confused. Get help right away if: You have chest pain. Your heart is beating very quickly. You have trouble breathing or you are breathing very quickly. You feel very weak or you faint. These symptoms may be an emergency. Get help right away. Call 911. Do not wait to see if the symptoms will go away. Do not drive yourself to the hospital. This information is not intended to replace advice given to you by your health care provider. Make sure you discuss any questions you have with your health care provider. Document Revised: 04/02/2022 Document Reviewed: 04/02/2022 Elsevier Patient Education  2024 [employer]   You can try immodium over the counter to help with diarrhea.

## 2023-09-04 NOTE — Progress Notes (Signed)
   09/04/2023 10:17 AM   Eulogio Ditch [id number] [id number]  Reason for visit: Follow up prostate cancer, ED  HPI: 72 year old male with significant alcohol abuse who presented with a persistently rising PSA up to 12.4, and prostate MRI showed PI-RADS 4 lesions of the right anterior transition zone in the mid gland, as well as the right anterior peripheral zone in the mid gland.  He underwent a fusion biopsy in [place] which showed Gleason score 4+3=7 disease in the regions of interest with 90% max core involvement.  Additionally, there were 2 cores of Gleason score 3+3=6 involving 5% at the right apex.   He opted for 6 months ADT(injection given 04/18/2022) and external beam radiation that was completed August 2023.  Overall, he is doing well.  He is having some occasional diarrhea, but unclear if this is related to diet or high alcohol intake.  Behavioral and diet strategies discussed, could try Imodium over-the-counter.  He has ED that has been responsive to sildenafil, this was not affordable, and we discussed the use of GoodRx/single care and a prescription sent in for 50 to 100 mg on demand.  PSA has decreased appropriately, most recently 2.37 and December 2023.  We reviewed the importance of monitoring the PSA trend, concept of PSA bounce, need to monitor PSA moving forward.  PSA today is pending.  Sildenafil 50 to 100 mg on demand, refilled Call with PSA results, anticipate 6 months with PSA prior  Sondra Come, MD  [facility] [address], [address] [place], [place] [address] [phone]

## 2023-09-05 ENCOUNTER — Telehealth: Payer: Self-pay

## 2023-09-05 DIAGNOSIS — C61 Malignant neoplasm of prostate: Secondary | ICD-10-CM

## 2023-09-05 LAB — PSA: Prostate Specific Ag, Serum: 1.8 ng/mL (ref 0.0–4.0)

## 2023-09-05 NOTE — Telephone Encounter (Signed)
-----   Message from Sondra Come sent at 09/05/2023  7:21 AM EST ----- Good news, PSA remains low at 1.8, recommend 65-month follow-up with PSA prior  Legrand Rams, MD 09/05/2023

## 2023-09-05 NOTE — Telephone Encounter (Signed)
Called pt informed him of the information below. Pt voiced understanding. PSA ordered. Appt scheduled.

## 2024-03-04 ENCOUNTER — Other Ambulatory Visit: Payer: Self-pay

## 2024-03-11 ENCOUNTER — Ambulatory Visit: Payer: Self-pay | Admitting: Urology
